# Patient Record
Sex: Female | Born: 1965 | Race: Black or African American | Hispanic: No | Marital: Single | Smoking: Never smoker
Health system: Southern US, Community
[De-identification: ages and names within clinical notes are randomized; demographics above are authoritative.]

## PROBLEM LIST (undated history)

## (undated) DIAGNOSIS — R030 Elevated blood-pressure reading, without diagnosis of hypertension: Secondary | ICD-10-CM

## (undated) DIAGNOSIS — IMO0001 Reserved for inherently not codable concepts without codable children: Secondary | ICD-10-CM

## (undated) DIAGNOSIS — D649 Anemia, unspecified: Secondary | ICD-10-CM

## (undated) DIAGNOSIS — G4730 Sleep apnea, unspecified: Secondary | ICD-10-CM

## (undated) DIAGNOSIS — M1990 Unspecified osteoarthritis, unspecified site: Secondary | ICD-10-CM

## (undated) DIAGNOSIS — I10 Essential (primary) hypertension: Secondary | ICD-10-CM

## (undated) DIAGNOSIS — Z923 Personal history of irradiation: Secondary | ICD-10-CM

## (undated) DIAGNOSIS — C50919 Malignant neoplasm of unspecified site of unspecified female breast: Secondary | ICD-10-CM

## (undated) HISTORY — DX: Essential (primary) hypertension: I10

## (undated) HISTORY — DX: Unspecified osteoarthritis, unspecified site: M19.90

## (undated) HISTORY — DX: Malignant neoplasm of unspecified site of unspecified female breast: C50.919

---

## 2004-05-31 ENCOUNTER — Emergency Department: Payer: Self-pay | Admitting: General Practice

## 2006-11-05 ENCOUNTER — Emergency Department: Payer: Self-pay | Admitting: Emergency Medicine

## 2008-01-12 ENCOUNTER — Emergency Department: Payer: Self-pay | Admitting: Unknown Physician Specialty

## 2008-05-30 ENCOUNTER — Emergency Department: Payer: Self-pay | Admitting: Emergency Medicine

## 2008-10-18 ENCOUNTER — Emergency Department: Payer: Self-pay | Admitting: Emergency Medicine

## 2010-08-12 ENCOUNTER — Ambulatory Visit: Payer: Self-pay | Admitting: Family Medicine

## 2011-09-09 ENCOUNTER — Ambulatory Visit: Payer: Self-pay | Admitting: Family Medicine

## 2011-11-28 ENCOUNTER — Emergency Department (HOSPITAL_COMMUNITY)
Admission: EM | Admit: 2011-11-28 | Discharge: 2011-11-28 | Disposition: A | Discharge status: Home / Self Care | Payer: Medicaid Other | Attending: Emergency Medicine | Admitting: Emergency Medicine

## 2011-11-28 ENCOUNTER — Encounter (HOSPITAL_COMMUNITY): Payer: Self-pay | Admitting: *Deleted

## 2011-11-28 DIAGNOSIS — J029 Acute pharyngitis, unspecified: Secondary | ICD-10-CM

## 2011-11-28 DIAGNOSIS — J019 Acute sinusitis, unspecified: Secondary | ICD-10-CM

## 2011-11-28 DIAGNOSIS — R070 Pain in throat: Secondary | ICD-10-CM | POA: Insufficient documentation

## 2011-11-28 MED ORDER — AMOXICILLIN-POT CLAVULANATE 500-125 MG PO TABS: 1.0000 | ORAL_TABLET | Freq: Two times a day (BID) | ORAL | Status: AC

## 2011-11-28 NOTE — ED Notes (Signed)
To ED for eval of congestion and sore throat for the past 4 days.

## 2011-11-28 NOTE — ED Provider Notes (Signed)
History     CSN: [id number]  Arrival date & time 11/28/11  1109   First MD Initiated Contact with Patient 11/28/11 1130      Chief Complaint  Patient presents with  . Nasal Congestion  . Sore Throat    (Consider location/radiation/quality/duration/timing/severity/associated sxs/prior treatment) HPI Comments: 46 year old female presents to the ED complaining of sore throat, nasal congestion, and sinus pressure for the past 3 days. Admits to associated cold sweats, cough with green mucus, and runny nose with green rhinorrhea. Cough worse when she first wakes up in the morning and subsides throughout the day. States she feels tired and her glands feel swollen. She tried taking Alka-Seltzer without any relief of her symptoms. Denies any abdominal pain, nausea, vomiting, rashes, chest pain, shortness of breath, weakness.  Patient is a 46 y.o. female presenting with pharyngitis. The history is provided by the patient.  Sore Throat Associated symptoms include congestion, coughing, diaphoresis, fatigue and a sore throat. Pertinent negatives include no abdominal pain, chest pain, fever, nausea, rash, vomiting or weakness.    History reviewed. No pertinent past medical history.  History reviewed. No pertinent past surgical history.  History reviewed. No pertinent family history.  History  Substance Use Topics  . Smoking status: Never Smoker   . Smokeless tobacco: Not on file  . Alcohol Use: No    OB History    Grav Para Term Preterm Abortions TAB SAB Ect Mult Living                  Review of Systems  Constitutional: Positive for diaphoresis and fatigue. Negative for fever and appetite change.  HENT: Positive for congestion, sore throat, rhinorrhea and sinus pressure. Negative for trouble swallowing.   Respiratory: Positive for cough. Negative for shortness of breath.   Cardiovascular: Negative for chest pain.  Gastrointestinal: Negative for nausea, vomiting and abdominal pain.    Skin: Negative for rash.  Neurological: Negative for dizziness, weakness and light-headedness.  Hematological: Positive for adenopathy.    Allergies  Review of patient's allergies indicates no known allergies.  Home Medications   Current Outpatient Rx  Name Route Sig Dispense Refill  . NYQUIL PO Oral Take 2 capsules by mouth 2 (two) times daily as needed. Cold and congestion    . AMOXICILLIN-POT CLAVULANATE 500-125 MG PO TABS Oral Take 1 tablet (500 mg total) by mouth 2 (two) times daily. 14 tablet 0    BP 115/77  Pulse 102  Temp 98.3 F (36.8 C) (Oral)  Resp 18  SpO2 99%  Physical Exam  Constitutional: She is oriented to person, place, and time. She appears well-developed and well-nourished. No distress.  HENT:  Head: Normocephalic and atraumatic.  Right Ear: Tympanic membrane, external ear and ear canal normal.  Left Ear: Tympanic membrane, external ear and ear canal normal.  Nose: Mucosal edema present. Right sinus exhibits maxillary sinus tenderness and frontal sinus tenderness. Left sinus exhibits maxillary sinus tenderness and frontal sinus tenderness.  Mouth/Throat: Uvula is midline and mucous membranes are normal. Posterior oropharyngeal erythema present. No oropharyngeal exudate or posterior oropharyngeal edema.       Post nasal drip present  Eyes: Conjunctivae are normal.  Neck: Normal range of motion. Neck supple.  Cardiovascular: Normal rate, regular rhythm and normal heart sounds.   Pulmonary/Chest: Effort normal and breath sounds normal. She has no wheezes.  Musculoskeletal: Normal range of motion. She exhibits no edema.  Lymphadenopathy:       Head (right side): Submandibular adenopathy  present.       Head (left side): Submandibular adenopathy present.    She has cervical adenopathy.       Right cervical: Superficial cervical adenopathy present.       Left cervical: Superficial cervical adenopathy present.  Neurological: She is alert and oriented to person,  place, and time.  Skin: Skin is warm. No rash noted.       clammy    ED Course  Procedures (including critical care time)  Labs Reviewed - No data to display No results found.   1. Sinusitis acute   2. Sore throat       MDM  46 year old female with acute sinusitis. Will treat with Augmentin. I gave her instructions for saline rinses, salt water gargles and advised her to take ibuprofen or Tylenol as needed.       Trevor Mace, PA-C 11/28/11 1156

## 2011-11-28 NOTE — ED Provider Notes (Signed)
Medical screening examination/treatment/procedure(s) were performed by non-physician practitioner and as supervising physician I was immediately available for consultation/collaboration.  Doug Sou, MD 11/28/11 [phone]

## 2013-04-03 ENCOUNTER — Ambulatory Visit: Payer: Self-pay

## 2013-05-08 ENCOUNTER — Ambulatory Visit: Payer: Self-pay

## 2014-07-11 ENCOUNTER — Other Ambulatory Visit: Payer: Self-pay | Admitting: *Deleted

## 2014-07-11 DIAGNOSIS — Z1231 Encounter for screening mammogram for malignant neoplasm of breast: Secondary | ICD-10-CM

## 2014-08-11 ENCOUNTER — Ambulatory Visit: Payer: Medicaid Other | Attending: Oncology

## 2014-08-11 ENCOUNTER — Ambulatory Visit
Admission: RE | Admit: 2014-08-11 | Discharge: 2014-08-11 | Disposition: A | Discharge status: Home / Self Care | Payer: Self-pay | Source: Ambulatory Visit | Attending: Oncology | Admitting: Oncology

## 2014-08-11 VITALS — Ht 64.57 in | Wt 285.3 lb

## 2014-08-11 DIAGNOSIS — Z0000 Encounter for general adult medical examination without abnormal findings: Secondary | ICD-10-CM

## 2014-08-11 DIAGNOSIS — R921 Mammographic calcification found on diagnostic imaging of breast: Secondary | ICD-10-CM | POA: Insufficient documentation

## 2014-08-11 DIAGNOSIS — R920 Mammographic microcalcification found on diagnostic imaging of breast: Secondary | ICD-10-CM

## 2014-08-11 DIAGNOSIS — Z1231 Encounter for screening mammogram for malignant neoplasm of breast: Secondary | ICD-10-CM | POA: Insufficient documentation

## 2014-08-11 NOTE — Progress Notes (Signed)
Subjective:     Patient ID: Felicia Ford, female   DOB: 12/05/1965, 49 y.o.   MRN: [id number]  HPI   Review of Systems     Objective:   Physical Exam  Pulmonary/Chest: Right breast exhibits no inverted nipple, no mass, no nipple discharge, no skin change and no tenderness. Left breast exhibits no inverted nipple, no mass, no nipple discharge, no skin change and no tenderness. Breasts are symmetrical.       Assessment:  49 year old patient presents for [facility] clinic visit.  CBE unremarkable.  Large pendulous breasts.  Instructed patient on breast self-exam using teach back method.    Plan:      Sent for bilateral screening mammogram.

## 2014-08-18 ENCOUNTER — Ambulatory Visit: Admission: RE | Admit: 2014-08-18 | Payer: Self-pay | Source: Ambulatory Visit

## 2014-08-18 ENCOUNTER — Other Ambulatory Visit: Payer: Self-pay | Admitting: *Deleted

## 2014-08-18 ENCOUNTER — Ambulatory Visit
Admission: RE | Admit: 2014-08-18 | Discharge: 2014-08-18 | Disposition: A | Discharge status: Home / Self Care | Payer: Self-pay | Source: Ambulatory Visit | Attending: Oncology | Admitting: Oncology

## 2014-08-18 DIAGNOSIS — R920 Mammographic microcalcification found on diagnostic imaging of breast: Secondary | ICD-10-CM | POA: Insufficient documentation

## 2014-08-18 DIAGNOSIS — R921 Mammographic calcification found on diagnostic imaging of breast: Secondary | ICD-10-CM

## 2014-08-26 ENCOUNTER — Other Ambulatory Visit: Payer: Self-pay | Admitting: Oncology

## 2014-08-26 ENCOUNTER — Other Ambulatory Visit: Payer: Self-pay | Admitting: *Deleted

## 2014-08-26 ENCOUNTER — Ambulatory Visit
Admission: RE | Admit: 2014-08-26 | Discharge: 2014-08-26 | Disposition: A | Discharge status: Home / Self Care | Payer: Self-pay | Source: Ambulatory Visit | Attending: Oncology | Admitting: Oncology

## 2014-08-26 ENCOUNTER — Ambulatory Visit: Admission: RE | Admit: 2014-08-26 | Payer: Self-pay | Source: Ambulatory Visit

## 2014-08-26 DIAGNOSIS — R921 Mammographic calcification found on diagnostic imaging of breast: Secondary | ICD-10-CM

## 2014-08-26 DIAGNOSIS — C50919 Malignant neoplasm of unspecified site of unspecified female breast: Secondary | ICD-10-CM

## 2014-08-26 DIAGNOSIS — D0512 Intraductal carcinoma in situ of left breast: Secondary | ICD-10-CM | POA: Insufficient documentation

## 2014-08-26 HISTORY — PX: BREAST BIOPSY: SHX20

## 2014-08-26 HISTORY — DX: Malignant neoplasm of unspecified site of unspecified female breast: C50.919

## 2014-08-27 LAB — SURGICAL PATHOLOGY

## 2014-08-28 ENCOUNTER — Encounter: Payer: Self-pay | Admitting: *Deleted

## 2014-08-28 NOTE — Progress Notes (Signed)
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 Patient notified of results.  Introduced IT trainer.  Scheduled patient with Dr. Bary Castilla on Monday 09/01/14 at 4:30.  Patient to arrive at 4:00 p.m. with photo ID, and current medications.   She is coming in on 08/29/14 at 8:00 a.m. to fill out [facility] paperwork with Tanya Nones RN. Oncology Nurse Navigator Documentation  Oncology Nurse Navigator Flowsheets 08/28/2014  Navigator Encounter Type Introductory phone call  Patient Visit Type Initial  Support Groups/Services Other  Time Spent with Patient 30

## 2014-08-29 ENCOUNTER — Encounter: Payer: Self-pay | Admitting: *Deleted

## 2014-08-29 NOTE — Progress Notes (Signed)
Met with patient today to complete genetic testing.  Patient meets NCCN guidelines for genetic testing based on her personal and family history.  Family history includes: patient with DCIS at age 49, her mother with ovarian cancer at age 59, materal grandmother with unknown cancer at age 69, maternal uncle with lung cancer age 18, and a materal great uncle with lung cancer at age 7.   Specimen collected for [facility] test and shipped to [facility] via [employer].  Discussed one of the Oncologist will review test results and any new medical management if needed.  Gave patient breast cancer educational literature, "My Breast Cancer Treatment Handbook" by Josephine Igo, RN.  Completed paperwork for [facility] application.  Offered support.  Patient is to call if she has any questions or needs.

## 2014-09-01 ENCOUNTER — Ambulatory Visit: Payer: Self-pay | Admitting: General Surgery

## 2014-09-01 ENCOUNTER — Other Ambulatory Visit: Payer: Self-pay | Admitting: General Surgery

## 2014-09-01 ENCOUNTER — Ambulatory Visit (INDEPENDENT_AMBULATORY_CARE_PROVIDER_SITE_OTHER): Payer: Medicaid Other | Admitting: General Surgery

## 2014-09-01 ENCOUNTER — Encounter: Payer: Self-pay | Admitting: General Surgery

## 2014-09-01 VITALS — BP 132/80 | HR 78 | Resp 14 | Ht 62.0 in | Wt 284.0 lb

## 2014-09-01 DIAGNOSIS — D0512 Intraductal carcinoma in situ of left breast: Secondary | ICD-10-CM | POA: Diagnosis not present

## 2014-09-01 NOTE — Progress Notes (Signed)
Patient ID: Felicia Ford, female DOB: 02/16/1966, 48 y.o. MRN: [id number]  Chief Complaint  Patient presents with  . Other    left breast Cancer    HPI Felicia Ford is a 48 y.o. female here for breast cancer discussion. She had her yearly mammogram done on 08/11/14 with added left side views on 08/18/14. She had a stereo biopsy of the left breast done on 08/26/14 at [facility] which came back positive for DCIS. she does have a family history of different cancers on her mother's side of the family. She had genetic testing done at the hospital on 08/29/14. HPI  Past Medical History  Diagnosis Date  . 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 Breast biopsy Left 08/26/2014    waiting on path results    Family History  Problem Relation Age of Onset  . Diabetes Mother   . Lupus Sister   . Lung cancer Maternal Uncle     Social History History  Substance Use Topics  . Smoking status: Never Smoker   . Smokeless tobacco: Never Used  . Alcohol Use: No    No Known Allergies  No current outpatient prescriptions on file.   No current facility-administered medications for this visit.    Review of Systems Review of Systems  Constitutional: Negative.  Respiratory: Negative.  Cardiovascular: Negative.    Blood pressure 132/80, pulse 78, resp. rate 14, height 5' 2" (1.575 m), weight 284 lb (128.822 kg), last menstrual period 08/22/2014.  Physical Exam Physical Exam  Constitutional: She is oriented to person, place, and time. She appears well-developed and well-nourished.  Eyes: Conjunctivae are normal. No scleral icterus.  Neck: Neck supple.  Cardiovascular: Normal rate, regular rhythm and normal heart sounds.  Pulmonary/Chest: Effort normal and breath sounds normal. Right breast exhibits no inverted nipple, no mass, no nipple discharge, no skin change and  no tenderness. Left breast exhibits no inverted nipple, no mass, no nipple discharge, no skin change and no tenderness.  Left  Lymphadenopathy:   She has no cervical adenopathy.   She has no axillary adenopathy.  Neurological: She is alert and oriented to person, place, and time.  Skin: Skin is warm and dry.    Data Reviewed Pathology showed intermediate grade DCIS. Receptor status pending excision. The pathologist made note that the area of DCIS extended beyond the area of microcalcifications in the sample submitted.  Assessment    DCIS of the left breast.    Plan    The patient underwent genetic testing through the cancer Center based on age of diagnosis and reported family history of ovarian cancer in her mother. Her case was presented at the breast tumor conference the evening of her evaluation and recommendation has been to postpone surgical intervention until the testing is back. If she was BRCA positive consideration would be given to bilateral mastectomy, although this would be an exceptionally morbid procedure in this massively obese woman with huge breasts. If the results are available will proceed with plans for surgical intervention on June 23, otherwise will need to postpone surgical intervention.  As there is a significant discrepancy between the biopsy clip placed at the time of her procedure the breast radiologist will plan to place a second clip on Wednesday, June 8 at the original biopsy site if it is visible.    Patient is scheduled for surgery at [facility] on 09/18/14. She will arrive at the   [facility] that day at 8:00 am. She will pre admit by phone. Patient is aware of date, time, and instructions.   Ref: Sheena Lambert BCCCP PCP: [facility]   Byrnett, Jeffrey W 09/02/2014, 7:53 AM      

## 2014-09-01 NOTE — Patient Instructions (Addendum)
Lumpectomy A lumpectomy is a form of "breast conserving" or "breast preservation" surgery. It may also be referred to as a partial mastectomy. During a lumpectomy, the portion of the breast that contains the cancerous tumor or breast mass (the lump) is removed. Some normal tissue around the lump may also be removed to make sure all of the tumor has been removed.  LET YOUR HEALTH CARE PROVIDER KNOW ABOUT:  Any allergies you have.  All medicines you are taking, including vitamins, herbs, eye drops, creams, and over-the-counter medicines.  Previous problems you or members of your family have had with the use of anesthetics.  Any blood disorders you have.  Previous surgeries you have had.  Medical conditions you have. RISKS AND COMPLICATIONS Generally, this is a safe procedure. However, problems can occur and include:  Bleeding.  Infection.  Pain.  Temporary swelling.  Change in the shape of the breast, particularly if a large portion is removed. BEFORE THE PROCEDURE  Ask your health care provider about changing or stopping your regular medicines. This is especially important if you are taking diabetes medicines or blood thinners.  Do not eat or drink anything after midnight on the night before the procedure or as directed by your health care provider. Ask your health care provider if you can take a sip of water with any approved medicines.  On the day of surgery, your health care provider will use a mammogram or ultrasound to locate and mark the tumor in your breast. These markings on your breast will show where the cut (incision) will be made. PROCEDURE   An IV tube will be put into one of your veins.  You may be given medicine to help you relax before the surgery (sedative). You will be given one of the following:  A medicine that numbs the area (local anesthetic).  A medicine that makes you fall asleep (general anesthetic).  Your health care provider will use a kind of  electric scalpel that uses heat to minimize bleeding (electrocautery knife).  A curved incision (like a smile or frown) that follows the natural curve of your breast is made, to allow for minimal scarring and better healing.  The tumor will be removed with some of the surrounding tissue. This will be sent to the lab for analysis. Your health care provider may also remove your lymph nodes at this time if needed.  Sometimes, but not always, a rubber tube called a drain will be surgically inserted into your breast area or armpit to collect excess fluid that may accumulate in the space where the tumor was. This drain is connected to a plastic bulb on the outside of your body. This drain creates suction to help remove the fluid.  The incisions will be closed with stitches (sutures).  A bandage may be placed over the incisions. AFTER THE PROCEDURE  You will be taken to the recovery area.  You will be given medicine for pain.  A small rubber drain may be placed in the breast for 2-3 days to prevent a collection of blood (hematoma) from developing in the breast. You will be given instructions on caring for the drain before you go home.  A pressure bandage (dressing) will be applied for 1-2 days to prevent bleeding. Ask your health care provider how to care for your bandage at home. Document Released: 04/25/2006 Document Revised: 07/29/2013 Document Reviewed: 08/17/2012 ExitCare Patient Information 2015 [employer]. This information is not intended to replace advice given to you by   your health care provider. Make sure you discuss any questions you have with your health care provider.  Patient is scheduled for surgery at [facility] on 09/18/14. She will arrive at the [facility] that day at 8:00 am. She will pre admit by phone. Patient is aware of date, time, and instructions.

## 2014-09-02 ENCOUNTER — Other Ambulatory Visit: Payer: Self-pay | Admitting: General Surgery

## 2014-09-02 DIAGNOSIS — C50912 Malignant neoplasm of unspecified site of left female breast: Secondary | ICD-10-CM

## 2014-09-02 DIAGNOSIS — D0510 Intraductal carcinoma in situ of unspecified breast: Secondary | ICD-10-CM | POA: Insufficient documentation

## 2014-09-02 NOTE — H&P (Signed)
Patient ID: Felicia Ford, female DOB: 1965-08-25, 49 y.o. MRN: [id number]  Chief Complaint  Patient presents with  . Other    left breast Cancer    HPI Felicia Ford is a 49 y.o. female here for breast cancer discussion. She had her yearly mammogram done on 08/11/14 with added left side views on 08/18/14. She had a stereo biopsy of the left breast done on 08/26/14 at [facility] which came back positive for DCIS. she does have a family history of different cancers on her mother's side of the family. She had genetic testing done at the hospital on 08/29/14. HPI  Past Medical History  Diagnosis Date  . 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 Breast biopsy Left 08/26/2014    waiting on path results    Family History  Problem Relation Age of Onset  . Diabetes Mother   . Lupus Sister   . Lung cancer Maternal Uncle     Social History History  Substance Use Topics  . Smoking status: Never Smoker   . Smokeless tobacco: Never Used  . Alcohol Use: No    No Known Allergies  No current outpatient prescriptions on file.   No current facility-administered medications for this visit.    Review of Systems Review of Systems  Constitutional: Negative.  Respiratory: Negative.  Cardiovascular: Negative.    Blood pressure 132/80, pulse 78, resp. rate 14, height _0  (1.575 m), weight 284 lb (128.822 kg), last menstrual period 08/22/2014.  Physical Exam Physical Exam  Constitutional: She is oriented to person, place, and time. She appears well-developed and well-nourished.  Eyes: Conjunctivae are normal. No scleral icterus.  Neck: Neck supple.  Cardiovascular: Normal rate, regular rhythm and normal heart sounds.  Pulmonary/Chest: Effort normal and breath sounds normal. Right breast exhibits no inverted nipple, no mass, no nipple discharge, no skin change and  no tenderness. Left breast exhibits no inverted nipple, no mass, no nipple discharge, no skin change and no tenderness.  Left  Lymphadenopathy:   She has no cervical adenopathy.   She has no axillary adenopathy.  Neurological: She is alert and oriented to person, place, and time.  Skin: Skin is warm and dry.    Data Reviewed Pathology showed intermediate grade DCIS. Receptor status pending excision. The pathologist made note that the area of DCIS extended beyond the area of microcalcifications in the sample submitted.  Assessment    DCIS of the left breast.    Plan    The patient underwent genetic testing through the cancer Center based on age of diagnosis and reported family history of ovarian cancer in her mother. Her case was presented at the breast tumor conference the evening of her evaluation and recommendation has been to postpone surgical intervention until the testing is back. If she was BRCA positive consideration would be given to bilateral mastectomy, although this would be an exceptionally morbid procedure in this massively obese woman with huge breasts. If the results are available will proceed with plans for surgical intervention on June 23, otherwise will need to postpone surgical intervention.  As there is a significant discrepancy between the biopsy clip placed at the time of her procedure the breast radiologist will plan to place a second clip on Wednesday, June 8 at the original biopsy site if it is visible.    Patient is scheduled for surgery at [facility] on 09/18/14. She will arrive at the  [place] that day at 8:00 am. She will pre admit by phone. Patient is aware of date, time, and instructions.   Ref: Wende Neighbors PCP: [facility] 09/02/2014, 7:53 AM

## 2014-09-03 ENCOUNTER — Other Ambulatory Visit: Payer: Self-pay | Admitting: Oncology

## 2014-09-03 ENCOUNTER — Ambulatory Visit
Admission: RE | Admit: 2014-09-03 | Discharge: 2014-09-03 | Disposition: A | Discharge status: Home / Self Care | Payer: Self-pay | Source: Ambulatory Visit | Attending: General Surgery | Admitting: General Surgery

## 2014-09-03 ENCOUNTER — Ambulatory Visit: Admission: RE | Admit: 2014-09-03 | Payer: Self-pay | Source: Ambulatory Visit

## 2014-09-03 DIAGNOSIS — C50912 Malignant neoplasm of unspecified site of left female breast: Secondary | ICD-10-CM | POA: Insufficient documentation

## 2014-09-03 DIAGNOSIS — Z0000 Encounter for general adult medical examination without abnormal findings: Secondary | ICD-10-CM

## 2014-09-05 NOTE — Progress Notes (Signed)
Received call from [place].  Patient there to see if biopsy clip needs to be moved.  Anxious.  Went to support patient.  Dr. Shon Hale explained to patient that procedure was not necessary, and that clip was in correct location.  Patient will return for wire localization, and surgery on 09/18/14.  Oncology Nurse Navigator Documentation  Oncology Nurse Navigator Flowsheets 08/28/2014 09/05/2014  Navigator Encounter Type Introductory phone call Other  Patient Visit Type Initial -  Support Groups/Services Other -  Time Spent with Patient 30 46

## 2014-09-10 ENCOUNTER — Encounter: Payer: Self-pay | Admitting: *Deleted

## 2014-09-10 ENCOUNTER — Other Ambulatory Visit: Payer: Self-pay

## 2014-09-10 DIAGNOSIS — Z833 Family history of diabetes mellitus: Secondary | ICD-10-CM | POA: Diagnosis not present

## 2014-09-10 DIAGNOSIS — Z8489 Family history of other specified conditions: Secondary | ICD-10-CM | POA: Diagnosis not present

## 2014-09-10 DIAGNOSIS — C50912 Malignant neoplasm of unspecified site of left female breast: Secondary | ICD-10-CM | POA: Diagnosis present

## 2014-09-10 DIAGNOSIS — Z801 Family history of malignant neoplasm of trachea, bronchus and lung: Secondary | ICD-10-CM | POA: Diagnosis not present

## 2014-09-10 DIAGNOSIS — D0512 Intraductal carcinoma in situ of left breast: Secondary | ICD-10-CM | POA: Diagnosis not present

## 2014-09-10 NOTE — Patient Instructions (Addendum)
  Your procedure is scheduled on: 09-18-14 Report to [facility]   Remember: Instructions that are not followed completely may result in serious medical risk, up to and including death, or upon the discretion of your surgeon and anesthesiologist your surgery may need to be rescheduled.    _X___ 1. Do not eat food or drink liquids after midnight. No gum chewing or hard candies.     _X___ 2. No Alcohol for 24 hours before or after surgery.   ____ 3. Bring all medications with you on the day of surgery if instructed.    _X___ 4. Notify your doctor if there is any change in your medical condition     (cold, fever, infections).     Do not wear jewelry, make-up, hairpins, clips or nail polish.  Do not wear lotions, powders, or perfumes. You may wear deodorant.  Do not shave 48 hours prior to surgery. Men may shave face and neck.  Do not bring valuables to the hospital.    [facility] is not responsible for any belongings or valuables.               Contacts, dentures or bridgework may not be worn into surgery.  Leave your suitcase in the car. After surgery it may be brought to your room.  For patients admitted to the hospital, discharge time is determined by your treatment team.   Patients discharged the day of surgery will not be allowed to drive home.   Please read over the following fact sheets that you were given:     ____ Take these medicines the morning of surgery with A SIP OF WATER:    1. NONE  2.   3.   4.  5.  6.  ____ Fleet Enema (as directed)   _X___ Use CHG Soap as directed  ____ Use inhalers on the day of surgery  ____ Stop metformin 2 days prior to surgery    ____ Take 1/2 of usual insulin dose the night before surgery and none on the morning of surgery.   ____ Stop Coumadin/Plavix/aspirin-N/A  ____ Stop Anti-inflammatories-NO NSAIDS OR ASPIRIN PRODUCTS-TYLENOL OK   ____ Stop supplements until after surgery.    ____ Bring C-Pap to the  hospital.

## 2014-09-16 ENCOUNTER — Telehealth: Payer: Self-pay | Admitting: *Deleted

## 2014-09-16 ENCOUNTER — Telehealth: Payer: Self-pay

## 2014-09-16 NOTE — Telephone Encounter (Signed)
-----   Message from Dom[place]a [place], [place] sent at 09/15/2014  1:59 PM EDT ----- Patient is scheduled for surgery for Thursday, 09-18-14. Dr. Bary Castilla wanted to have genetic testing results back before her surgery is done. Since you can look these up online, can you help me keep a check and see if results are available? Genetic testing was ordered by [facility]CP on 08-29-14. Thanks.

## 2014-09-16 NOTE — Telephone Encounter (Signed)
The patient was made aware that the BRCA testing will not be back until next month. Our options for management of this intermediate grade DCIS is as follows: 1) proceed with wide excision with needle localization and follow that up with patient therapy (strong consideration to partial breast based on her massive breast size) versus 2) awaiting the BRCA results and considering bilateral mastectomy as a prophylactic/treatment procedure.  The likelihood of her being BRCA positive is small, and she needs to decide whether she would consider bilateral mastectomy. She would be exceptionally difficult to reconstruct based on her size, and university referral would be recommended.  She'll consider her options and notify us tomorrow how she would like to proceed, or simply put things on hold pending the BRCA test results. 

## 2014-09-16 NOTE — Telephone Encounter (Signed)
She will check to see regarding results and let us know.

## 2014-09-16 NOTE — Telephone Encounter (Signed)
Testing actually started 09-10-14 (waited on her 1040 for $ issues). Target completion date 10-01-14 per Al Pimple RN.

## 2014-09-16 NOTE — Telephone Encounter (Signed)
Returned phone call to Karie Fetch RN at Dr. Dwyane Luo office regarding genetic test results for patient.   Per Tawana Scale at [facility], the projected date for test result release is October 01, 2014.

## 2014-09-17 ENCOUNTER — Telehealth: Payer: Self-pay | Admitting: *Deleted

## 2014-09-17 NOTE — Telephone Encounter (Signed)
-----   Message from [facility], LPN sent at 12/02/2834  9:11 AM EDT ----- Spoke with Estill Bamberg at [place] and she contacted the patient and she will be having the marker clip placed first thing tomorrow morning as the patient could not make it in today. I also spoke with Koren Shiver and notified her of this and asked that she contact us first thing when the patient's genetic testing results came back as the patent's surgery may have to be postponed if the results are not back in time.  ----- Message -----    From: Robert Bellow, MD    Sent: 09/01/2014   7:21 PM      To: Lesly Rubenstein, LPN  Please call mammography first thing in AM. They will get patient in to place a new clip in the breast at the original biopsy site.  Also, notify the patient that we will be waiting for her genetic test results to come back before surgery, may have to postpone the June 23 date, hopefully not.

## 2014-09-17 NOTE — Telephone Encounter (Signed)
Patient was contacted today and she wants to proceed with surgery as scheduled for tomorrow, 09-18-14.  Patient was instructed to arrive at mammography at 11:30 am instead of 8 am like she was originally instructed. This patient verbalizes understanding. She will call the office if she has further questions.

## 2014-09-17 NOTE — Progress Notes (Unsigned)
Notified pateint, and Dr. Bary Castilla of negative MyRISK results.  Patient reports she will be in [facility] 09/18/14 at 11:00 for wire localzation. Plan to meet there.

## 2014-09-18 ENCOUNTER — Encounter: Payer: Self-pay | Admitting: *Deleted

## 2014-09-18 ENCOUNTER — Ambulatory Visit: Payer: Medicaid Other | Admitting: Anesthesiology

## 2014-09-18 ENCOUNTER — Encounter
Admission: RE | Disposition: A | Discharge status: Home / Self Care | Payer: Self-pay | Source: Ambulatory Visit | Attending: General Surgery

## 2014-09-18 ENCOUNTER — Ambulatory Visit
Admission: RE | Admit: 2014-09-18 | Discharge: 2014-09-18 | Disposition: A | Discharge status: Home / Self Care | Payer: Medicaid Other | Source: Ambulatory Visit | Attending: General Surgery | Admitting: General Surgery

## 2014-09-18 ENCOUNTER — Other Ambulatory Visit: Payer: Self-pay | Admitting: Oncology

## 2014-09-18 ENCOUNTER — Ambulatory Visit
Admission: RE | Admit: 2014-09-18 | Discharge: 2014-09-18 | Disposition: A | Discharge status: Home / Self Care | Payer: Medicaid Other | Source: Ambulatory Visit | Attending: Oncology | Admitting: Oncology

## 2014-09-18 DIAGNOSIS — D0512 Intraductal carcinoma in situ of left breast: Secondary | ICD-10-CM | POA: Insufficient documentation

## 2014-09-18 DIAGNOSIS — Z0000 Encounter for general adult medical examination without abnormal findings: Secondary | ICD-10-CM

## 2014-09-18 DIAGNOSIS — Z801 Family history of malignant neoplasm of trachea, bronchus and lung: Secondary | ICD-10-CM | POA: Insufficient documentation

## 2014-09-18 DIAGNOSIS — Z833 Family history of diabetes mellitus: Secondary | ICD-10-CM | POA: Insufficient documentation

## 2014-09-18 DIAGNOSIS — Z8489 Family history of other specified conditions: Secondary | ICD-10-CM | POA: Insufficient documentation

## 2014-09-18 HISTORY — PX: BREAST BIOPSY: SHX20

## 2014-09-18 HISTORY — DX: Reserved for inherently not codable concepts without codable children: IMO0001

## 2014-09-18 HISTORY — DX: Anemia, unspecified: D64.9

## 2014-09-18 HISTORY — DX: Sleep apnea, unspecified: G47.30

## 2014-09-18 HISTORY — PX: BREAST LUMPECTOMY: SHX2

## 2014-09-18 HISTORY — DX: Elevated blood-pressure reading, without diagnosis of hypertension: R03.0

## 2014-09-18 LAB — POCT PREGNANCY, URINE: PREG TEST UR: NEGATIVE

## 2014-09-18 SURGERY — BREAST BIOPSY WITH NEEDLE LOCALIZATION
Anesthesia: General | Laterality: Left

## 2014-09-18 MED ORDER — ONDANSETRON HCL 4 MG/2ML IJ SOLN
4.0000 mg | Freq: Once | INTRAMUSCULAR | Status: AC | PRN
  Administered 2014-09-18: 4 mg via INTRAVENOUS

## 2014-09-18 MED ORDER — FENTANYL CITRATE (PF) 100 MCG/2ML IJ SOLN
INTRAMUSCULAR | Status: DC | PRN
  Administered 2014-09-18 (×2): 100 ug via INTRAVENOUS

## 2014-09-18 MED ORDER — CEFAZOLIN SODIUM-DEXTROSE 2-3 GM-% IV SOLR
2.0000 g | Freq: Once | INTRAVENOUS | Status: AC
  Administered 2014-09-18: 2 g via INTRAVENOUS

## 2014-09-18 MED ORDER — LACTATED RINGERS IV SOLN: INTRAVENOUS | Status: DC

## 2014-09-18 MED ORDER — CEFAZOLIN SODIUM-DEXTROSE 2-3 GM-% IV SOLR
INTRAVENOUS | Status: AC
  Administered 2014-09-18: 2 g via INTRAVENOUS
  Filled 2014-09-18: qty 50

## 2014-09-18 MED ORDER — HYDROCODONE-ACETAMINOPHEN 5-325 MG PO TABS: 1.0000 | ORAL_TABLET | Freq: Four times a day (QID) | ORAL | Status: DC | PRN

## 2014-09-18 MED ORDER — ONDANSETRON HCL 4 MG/2ML IJ SOLN
INTRAMUSCULAR | Status: DC | PRN
  Administered 2014-09-18: 4 mg via INTRAVENOUS

## 2014-09-18 MED ORDER — MIDAZOLAM HCL 2 MG/2ML IJ SOLN
INTRAMUSCULAR | Status: DC | PRN
  Administered 2014-09-18: 2 mg via INTRAVENOUS

## 2014-09-18 MED ORDER — FENTANYL CITRATE (PF) 100 MCG/2ML IJ SOLN
INTRAMUSCULAR | Status: AC
  Administered 2014-09-18: 25 ug via INTRAVENOUS
  Filled 2014-09-18: qty 2

## 2014-09-18 MED ORDER — LIDOCAINE HCL (CARDIAC) 20 MG/ML IV SOLN
INTRAVENOUS | Status: DC | PRN
  Administered 2014-09-18 (×2): 100 mg via INTRAVENOUS

## 2014-09-18 MED ORDER — SUCCINYLCHOLINE CHLORIDE 20 MG/ML IJ SOLN
INTRAMUSCULAR | Status: DC | PRN
  Administered 2014-09-18: 100 mg via INTRAVENOUS

## 2014-09-18 MED ORDER — ONDANSETRON HCL 4 MG/2ML IJ SOLN
INTRAMUSCULAR | Status: AC
  Administered 2014-09-18: 4 mg via INTRAVENOUS
  Filled 2014-09-18: qty 2

## 2014-09-18 MED ORDER — LACTATED RINGERS IV SOLN
INTRAVENOUS | Status: DC | PRN
  Administered 2014-09-18: 15:00:00 via INTRAVENOUS

## 2014-09-18 MED ORDER — PROPOFOL 10 MG/ML IV BOLUS
INTRAVENOUS | Status: DC | PRN
  Administered 2014-09-18: 200 mg via INTRAVENOUS

## 2014-09-18 MED ORDER — ACETAMINOPHEN 10 MG/ML IV SOLN
INTRAVENOUS | Status: AC
  Filled 2014-09-18: qty 100

## 2014-09-18 MED ORDER — FENTANYL CITRATE (PF) 100 MCG/2ML IJ SOLN
25.0000 ug | INTRAMUSCULAR | Status: DC | PRN
  Administered 2014-09-18 (×4): 25 ug via INTRAVENOUS

## 2014-09-18 MED ORDER — STERILE WATER FOR IRRIGATION IR SOLN
Status: DC | PRN
  Administered 2014-09-18: 100 mL

## 2014-09-18 MED ORDER — FAMOTIDINE 20 MG PO TABS
ORAL_TABLET | ORAL | Status: AC
  Administered 2014-09-18: 20 mg
  Filled 2014-09-18: qty 1

## 2014-09-18 MED ORDER — ACETAMINOPHEN 10 MG/ML IV SOLN
INTRAVENOUS | Status: DC | PRN
  Administered 2014-09-18: 1000 mg via INTRAVENOUS

## 2014-09-18 MED ORDER — FAMOTIDINE 20 MG PO TABS: 20.0000 mg | ORAL_TABLET | Freq: Once | ORAL | Status: DC

## 2014-09-18 MED ORDER — BUPIVACAINE-EPINEPHRINE (PF) 0.5% -1:200000 IJ SOLN
INTRAMUSCULAR | Status: DC | PRN
  Administered 2014-09-18: 30 mL

## 2014-09-18 MED ORDER — BUPIVACAINE-EPINEPHRINE (PF) 0.5% -1:200000 IJ SOLN
INTRAMUSCULAR | Status: AC
  Filled 2014-09-18: qty 30

## 2014-09-18 MED ORDER — HYDROCODONE-ACETAMINOPHEN 5-325 MG PO TABS
ORAL_TABLET | ORAL | Status: AC
  Administered 2014-09-18: 1
  Filled 2014-09-18: qty 1

## 2014-09-18 SURGICAL SUPPLY — 42 items
BANDAGE ELASTIC 6 CLIP NS LF (GAUZE/BANDAGES/DRESSINGS) IMPLANT
BENZOIN TINCTURE PRP APPL 2/3 (GAUZE/BANDAGES/DRESSINGS) ×3 IMPLANT
BLADE SURG 15 STRL SS SAFETY (BLADE) ×6 IMPLANT
BNDG GAUZE 4.5X4.1 6PLY STRL (MISCELLANEOUS) IMPLANT
CANISTER SUCT 1200ML W/VALVE (MISCELLANEOUS) ×3 IMPLANT
CHLORAPREP W/TINT 26ML (MISCELLANEOUS) ×3 IMPLANT
CLOSURE WOUND 1/2 X4 (GAUZE/BANDAGES/DRESSINGS) ×1
CNTNR SPEC 2.5X3XGRAD LEK (MISCELLANEOUS)
CONT SPEC 4OZ STER OR WHT (MISCELLANEOUS)
CONTAINER SPEC 2.5X3XGRAD LEK (MISCELLANEOUS) IMPLANT
COVER PROBE ULTRASOUND XLONG L (MISCELLANEOUS) ×3 IMPLANT
DEVICE DUBIN SPECIMEN MAMMOGRA (MISCELLANEOUS) ×3 IMPLANT
DRAPE LAPAROTOMY 100X77 ABD (DRAPES) ×3 IMPLANT
DRESSING TELFA 4X3 1S ST N-ADH (GAUZE/BANDAGES/DRESSINGS) IMPLANT
DRSG TEGADERM 4X4.75 (GAUZE/BANDAGES/DRESSINGS) ×3 IMPLANT
DRSG TELFA 3X8 NADH (GAUZE/BANDAGES/DRESSINGS) ×3 IMPLANT
ELECT CAUTERY BLADE TIP 2.5 (TIP) ×3
ELECT EZSTD 165MM 6.5IN (MISCELLANEOUS) ×3
ELECTRODE CAUTERY BLDE TIP 2.5 (TIP) ×1 IMPLANT
ELECTRODE EZSTD 165MM 6.5IN (MISCELLANEOUS) ×1 IMPLANT
GAUZE FLUFF 18X24 1PLY STRL (GAUZE/BANDAGES/DRESSINGS) ×3 IMPLANT
GLOVE BIO SURGEON STRL SZ7.5 (GLOVE) ×9 IMPLANT
GLOVE INDICATOR 8.0 STRL GRN (GLOVE) ×3 IMPLANT
GOWN STRL REUS W/ TWL LRG LVL3 (GOWN DISPOSABLE) ×2 IMPLANT
GOWN STRL REUS W/TWL LRG LVL3 (GOWN DISPOSABLE) ×4
KIT RM TURNOVER STRD PROC AR (KITS) ×3 IMPLANT
LABEL OR SOLS (LABEL) IMPLANT
MARGIN MAP 10MM (MISCELLANEOUS) ×3 IMPLANT
NDL SAFETY 22GX1.5 (NEEDLE) ×3 IMPLANT
NDL SAFETY 25GX1.5 (NEEDLE) ×3 IMPLANT
PACK BASIN MINOR ARMC (MISCELLANEOUS) ×3 IMPLANT
PAD GROUND ADULT SPLIT (MISCELLANEOUS) ×3 IMPLANT
STRIP CLOSURE SKIN 1/2X4 (GAUZE/BANDAGES/DRESSINGS) ×2 IMPLANT
SUT ETHILON 3-0 FS-10 30 BLK (SUTURE) ×3
SUT VIC AB 2-0 CT1 27 (SUTURE) ×4
SUT VIC AB 2-0 CT1 TAPERPNT 27 (SUTURE) ×2 IMPLANT
SUT VIC AB 4-0 FS2 27 (SUTURE) ×3 IMPLANT
SUTURE EHLN 3-0 FS-10 30 BLK (SUTURE) ×1 IMPLANT
SWABSTK COMLB BENZOIN TINCTURE (MISCELLANEOUS) ×3 IMPLANT
SYR CONTROL 10ML (SYRINGE) ×3 IMPLANT
TAPE TRANSPORE STRL 2 31045 (GAUZE/BANDAGES/DRESSINGS) IMPLANT
WATER STERILE IRR 1000ML POUR (IV SOLUTION) ×3 IMPLANT

## 2014-09-18 NOTE — OR Nursing (Signed)
Dr Terri Piedra in see pt - pt has a "boil" on inside of right upper leg that started draining today- md will order antibiotic

## 2014-09-18 NOTE — Discharge Instructions (Signed)
Wear bra day and night for the next 3 days. You may remove it at that time for showers. You should continue to wear bra day and night after is removed on Sunday.  You may wash under your arms and use deodorant as needed.  Tylenol, Advil, Aleve as needed for soreness.  Norco, hydrocodone, if needed for pain. This medication may constipate.  Laxity of choice if needed.   AMBULATORY SURGERY  DISCHARGE INSTRUCTIONS   1) The drugs that you were given will stay in your system until tomorrow so for the next 24 hours you should not:  A) Drive an automobile B) Make any legal decisions C) Drink any alcoholic beverage   2) You may resume regular meals tomorrow.  Today it is better to start with liquids and gradually work up to solid foods.  You may eat anything you prefer, but it is better to start with liquids, then soup and crackers, and gradually work up to solid foods.   3) Please notify your doctor immediately if you have any unusual bleeding, trouble breathing, redness and pain at the surgery site, drainage, fever, or pain not relieved by medication.                4) Additional Instructions:

## 2014-09-18 NOTE — Transfer of Care (Signed)
Immediate Anesthesia Transfer of Care Note  Patient: Felicia Ford  Procedure(s) Performed: Procedure(s): BREAST BIOPSY WITH NEEDLE LOCALIZATION (Left)  Patient Location: PACU  Anesthesia Type:General  Level of Consciousness: awake, alert , oriented and patient cooperative  Airway & Oxygen Therapy: Patient Spontanous Breathing and Patient connected to nasal cannula oxygen  Post-op Assessment: Report given to RN and Post -op Vital signs reviewed and stable  Post vital signs: Reviewed and stable  Last Vitals:  Filed Vitals:   09/18/14 1636  BP:   Pulse:   Temp: 36.8 C  Resp: 16    Complications: No apparent anesthesia complications

## 2014-09-18 NOTE — OR Nursing (Signed)
Iv attempted 8 times by rn staff and crna with no success pt tolerated well. Dr Boston Service notified of no iv at present

## 2014-09-18 NOTE — Op Note (Addendum)
Preoperative diagnosis: DCIS left breast.  Postoperative diagnosis: Same.  Operative procedure: Wide local excision with wire localization and ultrasound guidance.  Operating surgeon Hervey Ard, M.D.  Anesthesia: Gen. endotracheal, Marcaine 0.5% with 1-200,000 epinephrine 30 mL local infiltration.  Estimated blood loss: 10 mL.  Clinical note this 49 year old woman was found with an abnormal mammogram to have an area of DCIS.Felicia Ford She was a candidate for breast conservation. She underwent wire localization morning of the procedure.  Operative note: With patient under adequate general endotracheal anesthesia the breast was taped medially due to its massive size and the head of the bed elevated to provide better exposure of the uppe outer quadrant. Ultrasound was used to identify the course of the localizing wire to minimize undermining the skin. A curvilinear incision in the 12 to 2:00 position was made and carried at the skin subtenon's tissue with hemostasis achieved by electrocautery. The generous layer of adipose tissue was divided. The wire was found was followed down until there was evidence of changes from her recent biopsy. At this point a 6 x 6 x 8 cm block of tissue extending down to what not including the pectoralis fashion was excised orientated and sent for specimen radiograph confirming removal of the wire and the previously placed localizing clip. Gross examination by pathology showed the closest border to be 1 cm from the biopsy cavity. Wound was irrigated. Marcaine with epinephrine which had been used prior to skin incision was used to fill the remaining biopsy cavity at the end of the procedure for postoperative analgesia and hemostasis. The rest tissue is approximated with running 2-0 Vicryls suture in multiple layers. The skin was closed with a running 4-0 Vicryls septic suture for the skin. Benzoin, Steri-Strips Telfa and Tegaderm dressing was applied. The patient tolerated  procedure well and was taken to recovery room in stable condition.

## 2014-09-18 NOTE — Anesthesia Postprocedure Evaluation (Signed)
  Anesthesia Post-op Note  Patient: Felicia Ford  Procedure(s) Performed: Procedure(s): BREAST BIOPSY WITH NEEDLE LOCALIZATION (Left)  Anesthesia type:General  Patient location: PACU  Post pain: Pain level controlled  Post assessment: Post-op Vital signs reviewed, Patient's Cardiovascular Status Stable, Respiratory Function Stable, Patent Airway and No signs of Nausea or vomiting  Post vital signs: Reviewed and stable  Last Vitals:  Filed Vitals:   09/18/14 1653  BP: 141/81  Pulse: 82  Temp:   Resp: 15    Level of consciousness: awake, alert  and patient cooperative  Complications: No apparent anesthesia complications

## 2014-09-18 NOTE — H&P (Signed)
The patient is seen today for planned wide excision of an area of DCIS involving the left breast.  She reports a 4 day history of increasing pain and swelling in the medial aspect of the right thigh. Spontaneous drainage occurred with marked improvement in her discomfort.  She underwent needle localization without incident.  No change in cardiopulmonary status  Examination of the right proximal medial thigh shows a 5 cm area of thickening without warmth or induration and a central 1 cm area of necrosis suggestive of a site of spontaneous drainage. Possibility of a spider bite is evident. No fluctuance necessitating incision and drainage at this time.  The patient will be given Kefzol intravenously as an embolic prophylaxis.

## 2014-09-18 NOTE — Anesthesia Preprocedure Evaluation (Signed)
Anesthesia Evaluation  Patient identified by MRN, date of birth, ID band Patient awake    Reviewed: Allergy & Precautions, NPO status , Patient's Chart, lab work & pertinent test results  History of Anesthesia Complications Negative for: history of anesthetic complications  Airway Mallampati: II       Dental no notable dental hx.    Pulmonary sleep apnea ,    Pulmonary exam normal       Cardiovascular negative cardio ROS Normal cardiovascular exam    Neuro/Psych negative neurological ROS  negative psych ROS   GI/Hepatic negative GI ROS, Neg liver ROS,   Endo/Other  negative endocrine ROSMorbid obesity  Renal/GU negative Renal ROS  negative genitourinary   Musculoskeletal negative musculoskeletal ROS (+)   Abdominal Normal abdominal exam  (+) + obese,   Peds negative pediatric ROS (+)  Hematology  (+) anemia ,   Anesthesia Other Findings   Reproductive/Obstetrics negative OB ROS                             Anesthesia Physical Anesthesia Plan  ASA: III  Anesthesia Plan: General   Post-op Pain Management:    Induction: Intravenous  Airway Management Planned: Oral ETT  Additional Equipment:   Intra-op Plan:   Post-operative Plan: Extubation in OR  Informed Consent: I have reviewed the patients History and Physical, chart, labs and discussed the procedure including the risks, benefits and alternatives for the proposed anesthesia with the patient or authorized representative who has indicated his/her understanding and acceptance.     Plan Discussed with: CRNA  Anesthesia Plan Comments:         Anesthesia Quick Evaluation

## 2014-09-18 NOTE — Progress Notes (Signed)
Discussed pt's status with Dr Bary Castilla via tele - states ok to d/c to home without his visit

## 2014-09-19 ENCOUNTER — Encounter: Payer: Self-pay | Admitting: General Surgery

## 2014-09-22 ENCOUNTER — Telehealth: Payer: Self-pay | Admitting: *Deleted

## 2014-09-22 ENCOUNTER — Telehealth: Payer: Self-pay

## 2014-09-22 NOTE — Telephone Encounter (Signed)
Spoke with patient about her dressing. She may shower. She may remove the waterproof dressing but is to leave the steri strips in place. Patient is scheduled to follow up here on 09/25/14. Patient is aware of directions.

## 2014-09-22 NOTE — Telephone Encounter (Signed)
Phoned patient to as post surgery follow-up.  States she is doing well, and will see Dr. Bary Castilla on 09/25/14.  Phoned [facility]  at state level, and [facility] forms are not through process at this time.  Phoned [place] to determine date of submission to state Medicaid. Oncology Nurse Navigator Documentation  Oncology Nurse Navigator Flowsheets 09/22/2014  Navigator Encounter Type Telephone  Patient Visit Type Surgery  Barriers/Navigation Needs No barriers at this time  Support Groups/Services -  Time Spent with Patient 15

## 2014-09-22 NOTE — Telephone Encounter (Signed)
Patient called and wanted to know if she can take a shower with the bandage still in place. She had a lumpectomy on 09/18/14 by Dr.Byrnett

## 2014-09-23 LAB — SURGICAL PATHOLOGY

## 2014-09-24 ENCOUNTER — Telehealth: Payer: Self-pay | Admitting: *Deleted

## 2014-09-24 NOTE — Telephone Encounter (Signed)
-----   Message from Robert Bellow, MD sent at 09/24/2014  7:46 AM EDT ----- Please notify the patient that the final report is received and is all good. No invasive cancer. Margins are all clear. Follow-up tomorrow as scheduled. ----- Message -----    From: [facility]    Sent: 09/22/2014   9:59 AM      To: Robert Bellow, MD

## 2014-09-24 NOTE — Telephone Encounter (Signed)
Notified patient as instructed, patient very pleased. Discussed follow-up appointments, patient agrees

## 2014-09-25 ENCOUNTER — Other Ambulatory Visit (INDEPENDENT_AMBULATORY_CARE_PROVIDER_SITE_OTHER): Payer: Medicaid Other

## 2014-09-25 ENCOUNTER — Encounter: Payer: Self-pay | Admitting: General Surgery

## 2014-09-25 ENCOUNTER — Ambulatory Visit: Payer: Medicaid Other | Admitting: General Surgery

## 2014-09-25 VITALS — BP 130/74 | HR 76 | Resp 14 | Ht 62.0 in | Wt 293.0 lb

## 2014-09-25 DIAGNOSIS — D0512 Intraductal carcinoma in situ of left breast: Secondary | ICD-10-CM | POA: Diagnosis not present

## 2014-09-25 NOTE — Patient Instructions (Addendum)
Patient to see Dr. Baruch Gouty for evaluation.

## 2014-09-25 NOTE — Progress Notes (Signed)
Patient ID: Felicia Ford, female   DOB: 07-Jan-1966, 49 y.o.   MRN: [id number]  Chief Complaint  Patient presents with  . Routine Post Op    left lumpectomy    HPI [name] is a 49 y.o. female here today for her post op left lumpectomy done on 09/18/14. Patient state she is doing well now. She states over the weekend had a lot of pain, but this is significantly resolved in the last 3 days. She is wearing her bra day and night for support. HPI  Past Medical History  Diagnosis Date  . Sleep apnea   . Anemia   . Elevated blood pressure     PT GOING TO [facility] 09-11-14 FOR RECHECK PF BP TO DETERMINE IT BP MEDS NEED TO BE STARTED  . Breast cancer 08/26/14    Left breast DCIS, ER positive, PR positive.    Past Surgical History  Procedure Laterality Date  . Breast biopsy Left 08/26/2014    DCIS  . Breast biopsy Left 09/18/2014    Procedure: BREAST BIOPSY WITH NEEDLE LOCALIZATION;  Surgeon: Robert Bellow, MD;  Location: [facility] ORS;  Service: General;  Laterality: Left;    Family History  Problem Relation Age of Onset  . Diabetes Mother   . Lupus Sister   . Lung cancer Maternal Uncle     Social History History  Substance Use Topics  . Smoking status: Never Smoker   . Smokeless tobacco: Never Used  . Alcohol Use: No    No Known Allergies  Current Outpatient Prescriptions  Medication Sig Dispense Refill  . HYDROcodone-acetaminophen (NORCO) 5-325 MG per tablet Take 1-2 tablets by mouth every 6 (six) hours as needed for moderate pain. 30 tablet 0   No current facility-administered medications for this visit.    Review of Systems Review of Systems  Constitutional: Negative.   Respiratory: Negative.   Cardiovascular: Negative.     Blood pressure 130/74, pulse 76, resp. rate 14, height 5\' 2"  (1.575 m), weight 293 lb (132.904 kg), last menstrual period 08/22/2014.  Physical Exam Physical Exam  Constitutional: She is oriented to person, place, and time. She  appears well-developed and well-nourished.  Eyes: Conjunctivae are normal. No scleral icterus.  Neck: Neck supple.  Cardiovascular: Normal rate, regular rhythm and normal heart sounds.   Pulmonary/Chest: Breath sounds normal.  Left breast incision looks clean and healing well.   Lymphadenopathy:    She has no cervical adenopathy.  Neurological: She is alert and oriented to person, place, and time.  Skin: Skin is warm and dry.    Data Reviewed Ultrasound examination of the breast was completed to determine if she would be a candidate for partial breast radiation. There is a 1.4 x 4.8 x 5.0 cm seroma cavity a minimum of 2.5 cm from the skin.  Assessment    Doing well status post wide excision of DCIS.  Excellent candidate for partial breast radiation based on her very generous breast volume.    Plan    The role of postoperative radiation was reviewed. Final decision for whole breast or partial breast radiation will be made by the radiation oncologist and consultation with the patient.  We'll arrange to see the patient after her assessment by radiation oncology.  Patient to see Dr. Baruch Gouty for evaluation.    Robert Bellow 09/26/2014, 8:01 PM

## 2014-09-26 ENCOUNTER — Encounter: Payer: Self-pay | Admitting: General Surgery

## 2014-09-30 ENCOUNTER — Ambulatory Visit: Payer: Self-pay | Admitting: Oncology

## 2014-10-01 ENCOUNTER — Encounter: Payer: Self-pay | Admitting: Radiation Oncology

## 2014-10-01 ENCOUNTER — Ambulatory Visit
Admission: RE | Admit: 2014-10-01 | Discharge: 2014-10-01 | Disposition: A | Discharge status: Home / Self Care | Payer: Medicaid Other | Source: Ambulatory Visit | Attending: Radiation Oncology | Admitting: Radiation Oncology

## 2014-10-01 ENCOUNTER — Telehealth: Payer: Self-pay | Admitting: *Deleted

## 2014-10-01 VITALS — BP 138/81 | HR 73 | Temp 98.7°F | Resp 20 | Wt 286.7 lb

## 2014-10-01 DIAGNOSIS — D0512 Intraductal carcinoma in situ of left breast: Secondary | ICD-10-CM | POA: Insufficient documentation

## 2014-10-01 DIAGNOSIS — Z510 Encounter for antineoplastic radiation therapy: Secondary | ICD-10-CM | POA: Insufficient documentation

## 2014-10-01 DIAGNOSIS — C50912 Malignant neoplasm of unspecified site of left female breast: Secondary | ICD-10-CM

## 2014-10-01 MED ORDER — CEFADROXIL 500 MG PO CAPS: 500.0000 mg | ORAL_CAPSULE | Freq: Two times a day (BID) | ORAL | Status: DC

## 2014-10-01 NOTE — Consult Note (Signed)
Except an outstanding is perfect of Radiation Oncology NEW PATIENT EVALUATION  Name: Felicia Ford  MRN: [id number]  Date:   10/01/2014     DOB: 02/14/1966   This 49 y.o. 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 Bary Castilla  CHIEF COMPLAINT:  Chief Complaint  Patient presents with  . Breast Cancer    Pt is here for initial consultation for breast cancer.  She is a possible candidate for mammosite radiation.      DIAGNOSIS: The encounter diagnosis was Malignant neoplasm of female breast, left.   PREVIOUS INVESTIGATIONS:  Surgical pathology report reviewed Mammograms reviewed Clinical notes reviewed  HPI: Patient is a pleasant 49 year old female who presented with an abnormal mammogram of her left breast showing a small group of microcalcifications in the upper outer quadrant with recommendation for left breast stereotactic guided core biopsy. Biopsy was positive for ductal carcinoma in situ. She went on to have a wide local excision for a 0.7 cm cluster of microcalcifications grade 2 with a small focus of necrosis. Margins were clear at greater than 1 cm. 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 Patient is extremely large breasts had been discussed with surgeon about accelerated partial breast irradiation. She is seen today for consultation. She is healing well and is without complaint. She works as a Armed forces technical officer.  PLANNED TREATMENT REGIMEN: Accelerated partial breast radiation using high direct dose rate remote afterloading  PAST MEDICAL HISTORY:  has a past medical history of Sleep apnea; Anemia; Elevated blood pressure; and Breast cancer (08/26/14).    PAST SURGICAL HISTORY:  Past Surgical History  Procedure Laterality Date  . Breast biopsy Left 08/26/2014    DCIS  . Breast biopsy Left 09/18/2014    Procedure: BREAST BIOPSY WITH NEEDLE LOCALIZATION;  Surgeon:  Robert Bellow, MD;  Location: [facility] ORS;  Service: General;  Laterality: Left;    FAMILY HISTORY: family history includes Diabetes in her mother; Lung cancer in her maternal uncle; Lupus in her sister.  SOCIAL HISTORY:  reports that she has never smoked. She has never used smokeless tobacco. She reports that she does not drink alcohol or use illicit drugs.  ALLERGIES: Review of patient's allergies indicates no known allergies.  MEDICATIONS:  Current Outpatient Prescriptions  Medication Sig Dispense Refill  . HYDROcodone-acetaminophen (NORCO) 5-325 MG per tablet Take 1-2 tablets by mouth every 6 (six) hours as needed for moderate pain. (Patient not taking: Reported on 10/01/2014) 30 tablet 0   No current facility-administered medications for this encounter.    ECOG PERFORMANCE STATUS:  0 - Asymptomatic  REVIEW OF SYSTEMS:  Patient denies any weight loss, fatigue, weakness, fever, chills or night sweats. Patient denies any loss of vision, blurred vision. Patient denies any ringing  of the ears or hearing loss. No irregular heartbeat. Patient denies heart murmur or history of fainting. Patient denies any chest pain or pain radiating to her upper extremities. Patient denies any shortness of breath, difficulty breathing at night, cough or hemoptysis. Patient denies any swelling in the lower legs. Patient denies any nausea vomiting, vomiting of blood, or coffee ground material in the vomitus. Patient denies any stomach pain. Patient states has had normal bowel movements no significant constipation or diarrhea. Patient denies any dysuria, hematuria or significant nocturia. Patient denies any problems walking, swelling in the joints or loss of balance. Patient denies any skin changes, loss of  hair or loss of weight. Patient denies any excessive worrying or anxiety or significant depression. Patient denies any problems with insomnia. Patient denies excessive thirst, polyuria, polydipsia. Patient denies  any swollen glands, patient denies easy bruising or easy bleeding. Patient denies any recent infections, allergies or URI. Patient "s visual fields have not changed significantly in recent time.    PHYSICAL EXAM: BP 138/81 mmHg  Pulse 73  Temp(Src) 98.7 F (37.1 C)  Resp 20  Wt 286 lb 11.3 oz (130.05 kg)  LMP 09/16/2014 Well-developed obese female in NAD with large pendulous breasts. No dominant mass or nodularity is noted in either breast in 2 positions examined. No axillary or supraclavicular adenopathy is noted. Well-developed well-nourished patient in NAD. HEENT reveals PERLA, EOMI, discs not visualized.  Oral cavity is clear. No oral mucosal lesions are identified. Neck is clear without evidence of cervical or supraclavicular adenopathy. Lungs are clear to A&P. Cardiac examination is essentially unremarkable with regular rate and rhythm without murmur rub or thrill. Abdomen is benign with no organomegaly or masses noted. Motor sensory and DTR levels are equal and symmetric in the upper and lower extremities. Cranial nerves II through XII are grossly intact. Proprioception is intact. No peripheral adenopathy or edema is identified. No motor or sensory levels are noted. Crude visual fields are within normal range.   LABORATORY DATA: Surgical pathology reports are reviewed    RADIOLOGY RESULTS: Mammograms and ultrasound reviewed   IMPRESSION: Ductal carcinoma in situ of the left breast and 49 year old female status post wide local excision ER/PR positive for adjuvant radiation therapy  PLAN: At this time although it is against ACR guidelines regarding accelerated partial breast radiation based on her [place] age I believe this would be a suitable alternative to whole breast radiation which would carry significant skin reaction and skin morbidity based on her large breast size. I believe it would be an attractive alternative to go ahead with accelerated partial breast irradiation to her left  breast treating at 2 3400 cGy in 10 fractions. I discussed the case personally with surgeon. Risks and benefits of treatment including permanent thickening in the lumpectomy site from scarring, fatigue, possible skin reaction, all were discussed in detail with the patient. Nurse navigator was present. Should the catheter not be able to be placed or is too close to the skin surface would go back and recommended whole breast radiation. We'll coordinate with surgeon catheter placement and BrachyVision treatment planning prior to initiating treatment. Patient will also be candidate for tamoxifen therapy after completion of radiation.  I would like to take this opportunity for allowing me to participate in the care of your patient.Armstead Peaks., MD

## 2014-10-01 NOTE — Telephone Encounter (Signed)
Mammosite schedule reviewed with the patient Placement   10-07-14  at [facility] Scan 10-10-14 Treat 7-18 to 7-22 RX sent for [place] will be calling her for more details  Aware no showers and to wear her bra while mammosite in place. Pt agrees.

## 2014-10-02 ENCOUNTER — Other Ambulatory Visit: Payer: Self-pay

## 2014-10-02 ENCOUNTER — Other Ambulatory Visit: Payer: Self-pay | Admitting: General Surgery

## 2014-10-02 NOTE — H&P (Signed)
Patient ID: Felicia Ford, female DOB: 07-08-1965, 49 y.o. MRN: [id number]  Chief Complaint  Patient presents with  . Routine Post Op    left lumpectomy    HPI Felicia Ford is a 49 y.o. female here today for her post op left lumpectomy done on 09/18/14. Patient state she is doing well now. She states over the weekend had a lot of pain, but this is significantly resolved in the last 3 days. She is wearing her bra day and night for support. HPI  Past Medical History  Diagnosis Date  . Sleep apnea   . Anemia   . Elevated blood pressure     PT GOING TO [facility] 09[facility]11-14 FOR RECHECK PF BP TO DETERMINE IT BP MEDS NEED TO BE STARTED  . Breast cancer 08/26/14    Left breast DCIS, ER positive, PR positive.    Past Surgical History  Procedure Laterality Date  . Breast biopsy Left 08/26/2014    DCIS  . Breast biopsy Left 09/18/2014    Procedure: BREAST BIOPSY WITH NEEDLE LOCALIZATION; Surgeon: Robert Bellow, MD; Location: [facility] ORS; Service: General; Laterality: Left;    Family History  Problem Relation Age of Onset  . Diabetes Mother   . Lupus Sister   . Lung cancer Maternal Uncle     Social History History  Substance Use Topics  . Smoking status: Never Smoker   . Smokeless tobacco: Never Used  . Alcohol Use: No    No Known Allergies  Current Outpatient Prescriptions  Medication Sig Dispense Refill  . HYDROcodone-acetaminophen (NORCO) 5-325 MG per tablet Take 1-2 tablets by mouth every 6 (six) hours as needed for moderate pain. 30 tablet 0   No current facility-administered medications for this visit.    Review of Systems Review of Systems  Constitutional: Negative.  Respiratory: Negative.  Cardiovascular: Negative.    Blood pressure 130/74, pulse 76, resp. rate 14, height 5\' 2"  (1.575 m), weight 293 lb (132.904 kg), last menstrual period  08/22/2014.  Physical Exam Physical Exam  Constitutional: She is oriented to person, place, and time. She appears well-developed and well-nourished.  Eyes: Conjunctivae are normal. No scleral icterus.  Neck: Neck supple.  Cardiovascular: Normal rate, regular rhythm and normal heart sounds.  Pulmonary/Chest: Breath sounds normal.  Left breast incision looks clean and healing well.  Lymphadenopathy:   She has no cervical adenopathy.  Neurological: She is alert and oriented to person, place, and time.  Skin: Skin is warm and dry.    Data Reviewed Ultrasound examination of the breast was completed to determine if she would be a candidate for partial breast radiation. There is a 1.4 x 4.8 x 5.0 cm seroma cavity a minimum of 2.5 cm from the skin.  Assessment    Doing well status post wide excision of DCIS.  Excellent candidate for partial breast radiation based on her very generous breast volume.    Plan    The role of postoperative radiation was reviewed. Final decision for whole breast or partial breast radiation will be made by the radiation oncologist and consultation with the patient.  We'll arrange to see the patient after her assessment by radiation oncology.  Patient to see Dr. Baruch Gouty for evaluation.    Robert Bellow 09/26/2014, 8:01 PM    PLAN: At this time although it is against ACR guidelines regarding accelerated partial breast radiation based on her [place] age I believe this would be a suitable alternative to whole breast radiation which  would carry significant skin reaction and skin morbidity based on her large breast size. I believe it would be an attractive alternative to go ahead with accelerated partial breast irradiation to her left breast treating at 2 3400 cGy in 10 fractions. I discussed the case personally with surgeon. Risks and benefits of treatment including permanent thickening in the lumpectomy site from scarring, fatigue, possible skin reaction,  all were discussed in detail with the patient. Nurse navigator was present. Should the catheter not be able to be placed or is too close to the skin surface would go back and recommended whole breast radiation. We'll coordinate with surgeon catheter placement and BrachyVision treatment planning prior to initiating treatment. Patient will also be candidate for tamoxifen therapy after completion of radiation.  I would like to take this opportunity for allowing me to participate in the care of your patient.Armstead Peaks., MD

## 2014-10-06 ENCOUNTER — Encounter: Payer: Self-pay | Admitting: *Deleted

## 2014-10-06 NOTE — Patient Instructions (Signed)
  Your procedure is scheduled on: 10-07-14 Report to [place] To find out your arrival time please call [phone] between 1PM - 3PM on 10-06-14  Remember: Instructions that are not followed completely may result in serious medical risk, up to and including death, or upon the discretion of your surgeon and anesthesiologist your surgery may need to be rescheduled.    _X___ 1. Do not eat food or drink liquids after midnight. No gum chewing or hard candies.     _X___ 2. No Alcohol for 24 hours before or after surgery.   ____ 3. Bring all medications with you on the day of surgery if instructed.    ____ 4. Notify your doctor if there is any change in your medical condition     (cold, fever, infections).     Do not wear jewelry, make-up, hairpins, clips or nail polish.  Do not wear lotions, powders, or perfumes. You may wear deodorant.  Do not shave 48 hours prior to surgery. Men may shave face and neck.  Do not bring valuables to the hospital.    [facility] is not responsible for any belongings or valuables.               Contacts, dentures or bridgework may not be worn into surgery.  Leave your suitcase in the car. After surgery it may be brought to your room.  For patients admitted to the hospital, discharge time is determined by your  treatment team.   Patients discharged the day of surgery will not be allowed to drive home.   Please read over the following fact sheets that you were given:      __X__ Take these medicines the morning of surgery with A SIP OF WATER:    1. DURICEF-PER DR BYRNETT  2.   3.   4.  5.  6.  ____ Fleet Enema (as directed)   ____ Use CHG Soap as directed  ____ Use inhalers on the day of surgery  ____ Stop metformin 2 days prior to surgery    ____ Take 1/2 of usual insulin dose the night before surgery and none on the morning of surgery.   ____ Stop Coumadin/Plavix/aspirin-N/A  ____ Stop  Anti-inflammatories   ____ Stop supplements until after surgery.    ____ Bring C-Pap to the hospital.

## 2014-10-07 ENCOUNTER — Encounter
Admission: RE | Disposition: A | Discharge status: Home / Self Care | Payer: Self-pay | Source: Ambulatory Visit | Attending: General Surgery

## 2014-10-07 ENCOUNTER — Encounter: Payer: Self-pay | Admitting: *Deleted

## 2014-10-07 ENCOUNTER — Encounter: Payer: Self-pay | Admitting: Anesthesiology

## 2014-10-07 ENCOUNTER — Ambulatory Visit
Admission: RE | Admit: 2014-10-07 | Discharge: 2014-10-07 | Disposition: A | Discharge status: Home / Self Care | Payer: Medicaid Other | Source: Ambulatory Visit | Attending: General Surgery | Admitting: General Surgery

## 2014-10-07 DIAGNOSIS — Z8489 Family history of other specified conditions: Secondary | ICD-10-CM | POA: Diagnosis not present

## 2014-10-07 DIAGNOSIS — I10 Essential (primary) hypertension: Secondary | ICD-10-CM | POA: Insufficient documentation

## 2014-10-07 DIAGNOSIS — Z833 Family history of diabetes mellitus: Secondary | ICD-10-CM | POA: Insufficient documentation

## 2014-10-07 DIAGNOSIS — C50911 Malignant neoplasm of unspecified site of right female breast: Secondary | ICD-10-CM | POA: Diagnosis present

## 2014-10-07 DIAGNOSIS — Z801 Family history of malignant neoplasm of trachea, bronchus and lung: Secondary | ICD-10-CM | POA: Insufficient documentation

## 2014-10-07 DIAGNOSIS — G4730 Sleep apnea, unspecified: Secondary | ICD-10-CM | POA: Insufficient documentation

## 2014-10-07 DIAGNOSIS — Z79899 Other long term (current) drug therapy: Secondary | ICD-10-CM | POA: Insufficient documentation

## 2014-10-07 DIAGNOSIS — D0511 Intraductal carcinoma in situ of right breast: Secondary | ICD-10-CM | POA: Insufficient documentation

## 2014-10-07 DIAGNOSIS — D0512 Intraductal carcinoma in situ of left breast: Secondary | ICD-10-CM | POA: Diagnosis not present

## 2014-10-07 DIAGNOSIS — Z923 Personal history of irradiation: Secondary | ICD-10-CM

## 2014-10-07 DIAGNOSIS — D649 Anemia, unspecified: Secondary | ICD-10-CM | POA: Diagnosis not present

## 2014-10-07 HISTORY — PX: BREAST MAMMOSITE: SHX5264

## 2014-10-07 HISTORY — DX: Personal history of irradiation: Z92.3

## 2014-10-07 LAB — POCT PREGNANCY, URINE: Preg Test, Ur: NEGATIVE

## 2014-10-07 SURGERY — MAMMOSITE BREAST
Anesthesia: Choice | Laterality: Left | Wound class: Clean

## 2014-10-07 MED ORDER — FAMOTIDINE 20 MG PO TABS
20.0000 mg | ORAL_TABLET | Freq: Once | ORAL | Status: AC
  Administered 2014-10-07: 20 mg via ORAL

## 2014-10-07 MED ORDER — IOHEXOL 180 MG/ML  SOLN
INTRAMUSCULAR | Status: AC
  Filled 2014-10-07: qty 40

## 2014-10-07 MED ORDER — BUPIVACAINE HCL (PF) 0.5 % IJ SOLN
INTRAMUSCULAR | Status: AC
Start: 2014-10-07 — End: 2014-10-07
  Filled 2014-10-07: qty 30

## 2014-10-07 MED ORDER — LIDOCAINE-EPINEPHRINE (PF) 1 %-1:200000 IJ SOLN
INTRAMUSCULAR | Status: DC | PRN
  Administered 2014-10-07: 10 mL

## 2014-10-07 MED ORDER — SODIUM CHLORIDE 0.9 % IV SOLN
INTRAVENOUS | Status: DC | PRN
  Administered 2014-10-07: 10 mL

## 2014-10-07 MED ORDER — LIDOCAINE-EPINEPHRINE (PF) 1 %-1:200000 IJ SOLN
INTRAMUSCULAR | Status: AC
  Filled 2014-10-07: qty 30

## 2014-10-07 MED ORDER — LIDOCAINE HCL (PF) 1 % IJ SOLN
INTRAMUSCULAR | Status: AC
  Filled 2014-10-07: qty 30

## 2014-10-07 MED ORDER — FAMOTIDINE 20 MG PO TABS
ORAL_TABLET | ORAL | Status: AC
  Administered 2014-10-07: 20 mg via ORAL
  Filled 2014-10-07: qty 1

## 2014-10-07 MED ORDER — SODIUM CHLORIDE 0.9 % IJ SOLN
INTRAMUSCULAR | Status: AC
  Filled 2014-10-07: qty 50

## 2014-10-07 MED ORDER — SODIUM BICARBONATE 4 % IV SOLN
INTRAVENOUS | Status: AC
Start: 2014-10-07 — End: 2014-10-07
  Filled 2014-10-07: qty 5

## 2014-10-07 MED ORDER — BACITRACIN ZINC 500 UNIT/GM EX OINT
TOPICAL_OINTMENT | CUTANEOUS | Status: AC
  Filled 2014-10-07: qty 28.35

## 2014-10-07 SURGICAL SUPPLY — 37 items
BASIN GRAD PLASTIC 32OZ STRL (MISCELLANEOUS) IMPLANT
CANISTER SUCT 1200ML W/VALVE (MISCELLANEOUS) ×3 IMPLANT
CHLORAPREP W/TINT 26ML (MISCELLANEOUS) ×3 IMPLANT
CLOSURE WOUND 1/2 X4 (GAUZE/BANDAGES/DRESSINGS)
CNTNR SPEC 2.5X3XGRAD LEK (MISCELLANEOUS)
CONT SPEC 4OZ STER OR WHT (MISCELLANEOUS)
CONTAINER SPEC 2.5X3XGRAD LEK (MISCELLANEOUS) IMPLANT
COVER PROBE FLX POLY STRL (MISCELLANEOUS) ×3 IMPLANT
DEVICE CAVITY EVALUATION 9031 (MISCELLANEOUS) ×3 IMPLANT
DEVICE DUBIN SPECIMEN MAMMOGRA (MISCELLANEOUS) IMPLANT
DRAPE LAPAROTOMY 100X77 ABD (DRAPES) ×3 IMPLANT
DRESSING TELFA 4X3 1S ST N-ADH (GAUZE/BANDAGES/DRESSINGS) IMPLANT
GAUZE SPONGE 4X4 12PLY STRL (GAUZE/BANDAGES/DRESSINGS) IMPLANT
GLOVE BIO SURGEON STRL SZ7.5 (GLOVE) ×6 IMPLANT
GLOVE INDICATOR 8.0 STRL GRN (GLOVE) ×6 IMPLANT
GOWN STRL REUS W/ TWL LRG LVL3 (GOWN DISPOSABLE) ×2 IMPLANT
GOWN STRL REUS W/TWL LRG LVL3 (GOWN DISPOSABLE) ×4
HARMONIC SCALPEL FOCUS (MISCELLANEOUS) IMPLANT
KIT RM TURNOVER STRD PROC AR (KITS) ×3 IMPLANT
LABEL OR SOLS (LABEL) IMPLANT
MARGIN MAP 10MM (MISCELLANEOUS) IMPLANT
NDL SAFETY 22GX1.5 (NEEDLE) IMPLANT
NDL SAFETY 25GX1.5 (NEEDLE) ×3 IMPLANT
NEEDLE HYPO 25GX1X1/2 BEV (NEEDLE) ×3 IMPLANT
NS IRRIG 500ML POUR BTL (IV SOLUTION) ×3 IMPLANT
PACK BASIN MINOR ARMC (MISCELLANEOUS) ×3 IMPLANT
PAD GROUND ADULT SPLIT (MISCELLANEOUS) ×3 IMPLANT
STRIP CLOSURE SKIN 1/2X4 (GAUZE/BANDAGES/DRESSINGS) IMPLANT
SUT ETHILON 3-0 FS-10 30 BLK (SUTURE)
SUT VIC AB 2-0 CT1 27 (SUTURE)
SUT VIC AB 2-0 CT1 TAPERPNT 27 (SUTURE) IMPLANT
SUT VIC AB 4-0 FS2 27 (SUTURE) IMPLANT
SUTURE EHLN 3-0 FS-10 30 BLK (SUTURE) IMPLANT
SWABSTK COMLB BENZOIN TINCTURE (MISCELLANEOUS) IMPLANT
SYR CONTROL 10ML (SYRINGE) IMPLANT
TOWEL OR 17X26 4PK STRL BLUE (TOWEL DISPOSABLE) IMPLANT
TRAY MAMMOSITE APPLI 4 5 CM (KITS) ×3 IMPLANT

## 2014-10-07 NOTE — Op Note (Signed)
Preoperative diagnosis: Right breast DCIS.  Postoperative diagnosis: Same.  Operative procedure: Placement of MammoSite balloon catheter for partial breast radiation.  Operating surgeon: Hervey Ard, M.D.  Anesthesia: 10 mL 1% Xylocaine with 1-200,000 of epinephrine.  Preoperative antibiotic: Duricef 500 mg preprocedure.  Operative note: The patient has been evaluated by radiation oncology and is felt to be a candidate for partial breast radiation.  The patient was identified and time out called. The breast was taped to the right to provide better exposure of the upper outer quadrant. Chlor prep was applied to the skin. Local and a steak was infiltrated. The cavity evaluation device was placed after draining 150 mL from the seroma cavity. This showed good positioning with a minimum of 1.5 cm between the balloon and the skin. The cavity evaluation device was removed and replaced with a treatment balloon inflated 70 mL of saline mixed with Omnipaque. Bacitracin was applied to the skin exit site followed by bulky dressing.  The patient tolerated procedure well and was taken to day surgery for discharge.

## 2014-10-07 NOTE — OR Nursing (Signed)
Patient to room 7 via stretcher from the OR;  Patient is a/o x 4; skin w/d; resp even and unlabored; patient has seen dr byrnett at this time and was instructed to go directly to his office when she leaves; pt has had no anesthesia; no iv site to assess; pt had local to the operative site; which is assessed without complication at the time of discharge. Gauze with papertape in place w/out drainage noted; pt discharged via wheelchair to her family.

## 2014-10-07 NOTE — Progress Notes (Signed)
Vitals taken during procedure. No sedation given. Dr. Bary Castilla aware of elevated BP.

## 2014-10-07 NOTE — Progress Notes (Signed)
Return to work note

## 2014-10-08 ENCOUNTER — Ambulatory Visit: Payer: Self-pay | Admitting: General Surgery

## 2014-10-09 ENCOUNTER — Ambulatory Visit (INDEPENDENT_AMBULATORY_CARE_PROVIDER_SITE_OTHER): Payer: Medicaid Other | Admitting: *Deleted

## 2014-10-09 DIAGNOSIS — D0512 Intraductal carcinoma in situ of left breast: Secondary | ICD-10-CM

## 2014-10-09 NOTE — Progress Notes (Signed)
Patient came in this morning and wanted a dressing change. Noticed just a small amount of  drainage , I applied two abdominal pads with some paper tape. Patient goes to the cancer center tomorrow.

## 2014-10-10 ENCOUNTER — Ambulatory Visit
Admission: RE | Admit: 2014-10-10 | Discharge: 2014-10-10 | Disposition: A | Discharge status: Home / Self Care | Payer: Medicaid Other | Source: Ambulatory Visit | Attending: Radiation Oncology | Admitting: Radiation Oncology

## 2014-10-10 ENCOUNTER — Encounter: Payer: Self-pay | Admitting: General Surgery

## 2014-10-10 DIAGNOSIS — Z510 Encounter for antineoplastic radiation therapy: Secondary | ICD-10-CM | POA: Diagnosis present

## 2014-10-10 DIAGNOSIS — D0512 Intraductal carcinoma in situ of left breast: Secondary | ICD-10-CM | POA: Diagnosis not present

## 2014-10-13 ENCOUNTER — Ambulatory Visit
Admission: RE | Admit: 2014-10-13 | Discharge: 2014-10-13 | Disposition: A | Discharge status: Home / Self Care | Payer: Medicaid Other | Source: Ambulatory Visit | Attending: Radiation Oncology | Admitting: Radiation Oncology

## 2014-10-13 ENCOUNTER — Ambulatory Visit: Payer: Medicaid Other

## 2014-10-13 DIAGNOSIS — Z510 Encounter for antineoplastic radiation therapy: Secondary | ICD-10-CM | POA: Diagnosis not present

## 2014-10-14 ENCOUNTER — Ambulatory Visit
Admission: RE | Admit: 2014-10-14 | Discharge: 2014-10-14 | Disposition: A | Discharge status: Home / Self Care | Payer: Medicaid Other | Source: Ambulatory Visit | Attending: Radiation Oncology | Admitting: Radiation Oncology

## 2014-10-14 DIAGNOSIS — Z510 Encounter for antineoplastic radiation therapy: Secondary | ICD-10-CM | POA: Diagnosis not present

## 2014-10-15 ENCOUNTER — Ambulatory Visit
Admission: RE | Admit: 2014-10-15 | Discharge: 2014-10-15 | Disposition: A | Discharge status: Home / Self Care | Payer: Medicaid Other | Source: Ambulatory Visit | Attending: Radiation Oncology | Admitting: Radiation Oncology

## 2014-10-15 DIAGNOSIS — Z510 Encounter for antineoplastic radiation therapy: Secondary | ICD-10-CM | POA: Diagnosis not present

## 2014-10-16 ENCOUNTER — Ambulatory Visit
Admission: RE | Admit: 2014-10-16 | Discharge: 2014-10-16 | Disposition: A | Discharge status: Home / Self Care | Payer: Medicaid Other | Source: Ambulatory Visit | Attending: Radiation Oncology | Admitting: Radiation Oncology

## 2014-10-16 DIAGNOSIS — Z510 Encounter for antineoplastic radiation therapy: Secondary | ICD-10-CM | POA: Diagnosis not present

## 2014-10-17 ENCOUNTER — Ambulatory Visit
Admission: RE | Admit: 2014-10-17 | Discharge: 2014-10-17 | Disposition: A | Discharge status: Home / Self Care | Payer: Medicaid Other | Source: Ambulatory Visit | Attending: Radiation Oncology | Admitting: Radiation Oncology

## 2014-10-17 DIAGNOSIS — Z510 Encounter for antineoplastic radiation therapy: Secondary | ICD-10-CM | POA: Diagnosis not present

## 2014-10-20 ENCOUNTER — Encounter: Payer: Self-pay | Admitting: *Deleted

## 2014-10-20 ENCOUNTER — Telehealth: Payer: Self-pay | Admitting: *Deleted

## 2014-10-20 NOTE — Telephone Encounter (Signed)
Needed work note for being absent during the dates of her mammosite treatments.    This letter was written and was faxed per the patient's request to [phone]

## 2014-11-26 ENCOUNTER — Encounter: Payer: Self-pay | Admitting: Radiation Oncology

## 2014-11-26 ENCOUNTER — Ambulatory Visit
Admission: RE | Admit: 2014-11-26 | Discharge: 2014-11-26 | Disposition: A | Discharge status: Home / Self Care | Payer: Medicaid Other | Source: Ambulatory Visit | Attending: Radiation Oncology | Admitting: Radiation Oncology

## 2014-11-26 VITALS — BP 142/91 | HR 76 | Temp 96.8°F | Resp 18 | Wt 288.4 lb

## 2014-11-26 DIAGNOSIS — C50912 Malignant neoplasm of unspecified site of left female breast: Secondary | ICD-10-CM

## 2014-11-26 NOTE — Progress Notes (Signed)
Radiation Oncology Follow up Note  Name: Felicia Ford   Date:   11/26/2014 MRN:  [id number] DOB: 1965/05/22    This 49 y.o. female presents to the clinic today for breast cancer status post accelerated partial breast irradiation to her left breast now 1 month out.  REFERRING PROVIDER: No ref. provider found  HPI: Patient is a pleasant 49 year old female now 1 month out having completed accelerated partial breast radiation to her left breast for ductal carcinoma in situ stage 0. 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 Patient has not started tamoxifen yet has not had a follow-up appointment with surgeon we are arranging that. She is doing well she specifically denies breast tenderness cough or bone pain..  COMPLICATIONS OF TREATMENT: none  FOLLOW UP COMPLIANCE: keeps appointments   PHYSICAL EXAM:  BP 142/91 mmHg  Pulse 76  Temp(Src) 96.8 F (36 C)  Resp 18  Wt 288 lb 5.8 oz (130.8 kg) Well-developed obese female in NAD. Breasts are extremely large. Lungs are clear to A&P cardiac examination essentially unremarkable with regular rate and rhythm. No dominant mass or nodularity is noted in either breast in 2 positions examined. Incision is well-healed. No axillary or supraclavicular adenopathy is appreciated. Cosmetic result is excellent. Well-developed well-nourished patient in NAD. HEENT reveals PERLA, EOMI, discs not visualized.  Oral cavity is clear. No oral mucosal lesions are identified. Neck is clear without evidence of cervical or supraclavicular adenopathy. Lungs are clear to A&P. Cardiac examination is essentially unremarkable with regular rate and rhythm without murmur rub or thrill. Abdomen is benign with no organomegaly or masses noted. Motor sensory and DTR levels are equal and symmetric in the upper and lower extremities. Cranial nerves II through XII are grossly intact. Proprioception is intact. No peripheral adenopathy or edema is identified. No motor or sensory levels are noted. Crude  visual fields are within normal range.   RADIOLOGY RESULTS: No current films for review  PLAN: Present time she is doing 1 month out from accelerated partial breast irradiation. I like to start tamoxifen have set up an appointment with her surgeon for follow-up. Otherwise I'm please were overall progress I've asked to see her back in 4-5 months for follow-up. Patient is to call sooner with any concerns.  I would like to take this opportunity for allowing me to participate in the care of your patient.Armstead Peaks., MD

## 2014-12-02 ENCOUNTER — Ambulatory Visit (INDEPENDENT_AMBULATORY_CARE_PROVIDER_SITE_OTHER): Payer: Medicaid Other | Admitting: General Surgery

## 2014-12-02 ENCOUNTER — Encounter: Payer: Self-pay | Admitting: General Surgery

## 2014-12-02 VITALS — BP 124/82 | HR 81 | Resp 16 | Ht 62.0 in | Wt 288.2 lb

## 2014-12-02 DIAGNOSIS — D0510 Intraductal carcinoma in situ of unspecified breast: Secondary | ICD-10-CM | POA: Insufficient documentation

## 2014-12-02 DIAGNOSIS — D0512 Intraductal carcinoma in situ of left breast: Secondary | ICD-10-CM

## 2014-12-02 MED ORDER — TAMOXIFEN CITRATE 20 MG PO TABS: 20.0000 mg | ORAL_TABLET | Freq: Every day | ORAL | Status: DC

## 2014-12-02 NOTE — Progress Notes (Signed)
Patient ID: DAIJHA Ford, female   DOB: 07/27/1965, 49 y.o.   MRN: [id number]  Chief Complaint  Patient presents with  . Follow-up    mammosite placement    [name] is a 49 y.o. female.  She is here today for a follow up from a left side mammosite. Patient has no pain. She states she tolerated the radiation well.  The incision site looks well, RUQ wounds doing great. She still senses the breast as being heavy. She is wearing a bra at night.  HPI  Past Medical History  Diagnosis Date  . Sleep apnea   . Anemia   . Elevated blood pressure     PT GOING TO [facility] 09-11[facility]14 FOR RECHECK PF BP TO DETERMINE IT BP MEDS NEED TO BE STARTED  . Breast cancer 08/26/14    Left breast DCIS, ER positive, PR positive.    Past Surgical History  Procedure Laterality Date  . Breast biopsy Left 08/26/2014    DCIS  . Breast biopsy Left 09/18/2014    Procedure: BREAST BIOPSY WITH NEEDLE LOCALIZATION;  Surgeon: Robert Bellow, MD;  Location: [facility] ORS;  Service: General;  Laterality: Left;  . Breast mammosite Left 10/07/2014    Procedure: MAMMOSITE BREAST;  Surgeon: Robert Bellow, MD;  Location: [facility] ORS;  Service: General;  Laterality: Left;    Family History  Problem Relation Age of Onset  . Diabetes Mother   . Lupus Sister   . Lung cancer Maternal Uncle     Social History Social History  Substance Use Topics  . Smoking status: Never Smoker   . Smokeless tobacco: Never Used  . Alcohol Use: No    No Known Allergies  Current Outpatient Prescriptions  Medication Sig Dispense Refill  . tamoxifen (NOLVADEX) 20 MG tablet Take 1 tablet (20 mg total) by mouth daily. 30 tablet 11   No current facility-administered medications for this visit.    Review of Systems Review of Systems  Constitutional: Negative.   Respiratory: Negative.   Cardiovascular: Negative.     Blood pressure 124/82, pulse 81, resp. rate 16, height 5\' 2"  (1.575 m), weight 288 lb 3.2 oz (130.727 kg),  last menstrual period 11/29/2014, SpO2 99 %.  Physical Exam Physical Exam  Pulmonary/Chest:      Data Reviewed ER/PR positive DCIS.  Assessment    Doing well status post accelerated partial breast radiation.    Plan    Indication for antiestrogen therapy reviewed. Patient is still having monthly menses and therefore no risk for uterine cancer.  DVT risk reviewed. Potential for vasomotor symptoms discussed.  The patient will have a follow-up left breast diagnostic mammogram and office visit in December 2016.    Start taking Tomoxifen. 1X daily for 1 month. The 2-3 weeks will be the worst and then get better. In 1 month call office with a report on how the medication is doing.  Follow up in December PCP: [facility] 12/02/2014, 2:55 PM

## 2014-12-03 ENCOUNTER — Telehealth: Payer: Self-pay

## 2014-12-03 NOTE — Telephone Encounter (Signed)
-----   Message from Robert Bellow, MD sent at 12/02/2014  2:57 PM EDT ----- Please asked the patient to take a daily baby aspirin with her tamoxifen. Thank you

## 2014-12-03 NOTE — Telephone Encounter (Signed)
Notified patient as instructed, patient pleased and will take a daily baby aspirin.

## 2014-12-19 ENCOUNTER — Telehealth: Payer: Self-pay | Admitting: General Surgery

## 2014-12-19 NOTE — Telephone Encounter (Signed)
PT CALLED TODAY TO GIVE HER REPORT ON TAKING TAMOXIFEN.SHE STATES SHE IS HAVING A RACING HEART & IS UNABLE TO SLEEP AT NIGHT DUE TO ANXIETY. SHE'S UNABLE ABLE TO DESCRIBE THE FEELING.HER LAST APPT WAS 12-02-14 & IS RECALLS FOR December 2016.

## 2014-12-22 NOTE — Telephone Encounter (Signed)
Please contact the patient and see if she is still having menstrual periods. She should stop the tamoxifen, we'll look at other options for hormone suppression based on her history.

## 2014-12-22 NOTE — Telephone Encounter (Signed)
Spoke with patient and she says that she is still having her periods. I let her know to stop the Tamoxifen for now and that Dr Bary Castilla would look into other hormone suppression therapy and get back with her.

## 2015-01-07 NOTE — Telephone Encounter (Signed)
I spoke w/ medical oncology. Unlikely reaction to Tamoxifen.  No alternatives for a woman still with active menses. Lupron/ oophorectomy with significant cost/ risk for DCIS.  Will arrange OV to discuss.

## 2015-01-16 ENCOUNTER — Other Ambulatory Visit: Payer: Self-pay

## 2015-01-16 DIAGNOSIS — D0512 Intraductal carcinoma in situ of left breast: Secondary | ICD-10-CM

## 2015-01-21 ENCOUNTER — Ambulatory Visit (INDEPENDENT_AMBULATORY_CARE_PROVIDER_SITE_OTHER): Payer: Medicaid Other | Admitting: General Surgery

## 2015-01-21 ENCOUNTER — Encounter: Payer: Self-pay | Admitting: General Surgery

## 2015-01-21 VITALS — BP 130/80 | HR 70 | Resp 16 | Ht 62.0 in | Wt 286.0 lb

## 2015-01-21 DIAGNOSIS — D0512 Intraductal carcinoma in situ of left breast: Secondary | ICD-10-CM | POA: Diagnosis not present

## 2015-01-21 DIAGNOSIS — N951 Menopausal and female climacteric states: Secondary | ICD-10-CM | POA: Diagnosis not present

## 2015-01-21 NOTE — Patient Instructions (Addendum)
Recommend Tamoxifen, try and start with 1/2 tablet for a week then 1/2 tablet twice a day with a goal of one tablet daily. Call in one month with status update. Dr Tula Nakayama Psychiatric nurse.

## 2015-01-21 NOTE — Progress Notes (Signed)
Patient ID: Felicia Ford, female   DOB: 01-28-1966, 49 y.o.   MRN: [id number]  Chief Complaint  Patient presents with  . Follow-up    HPI Felicia Ford is a 49 y.o. female.  Here today to discuss hormone suppression therapy. The patient called to report a racing heart and other symptoms when she started her tamoxifen. Included in this was some difficulty with sleep. These of all gradually improved, but she discontinue the medications.  The patient reports she's had long-standing bilateral breast discomfort due to the size of her breasts. She makes use of 3 brought to provide adequate support. She has noted moderate decrease in the left breast side since wide excision and radiation treatment. She brought up the possibility of breast reduction.   She does states that her breast "they just hurt" they are so bid and her back is starting to bother her as well. She wears a sports bra for comfort.  The clinical history was confirmed with the patient during her visit.  HPI  Past Medical History  Diagnosis Date  . Sleep apnea   . Anemia   . Elevated blood pressure     PT GOING TO [facility] 09-11-14 FOR RECHECK PF BP TO DETERMINE IT BP MEDS NEED TO BE STARTED  . Breast cancer ([place]) 08/26/14    Left breast DCIS, ER positive, PR positive.    Past Surgical History  Procedure Laterality Date  . Breast biopsy Left 08/26/2014    DCIS  . Breast biopsy Left 09/18/2014    Procedure: BREAST BIOPSY WITH NEEDLE LOCALIZATION;  Surgeon: Robert Bellow, MD;  Location: [facility] ORS;  Service: General;  Laterality: Left;  . Breast mammosite Left 10/07/2014    Procedure: MAMMOSITE BREAST;  Surgeon: Robert Bellow, MD;  Location: [facility] ORS;  Service: General;  Laterality: Left;    Family History  Problem Relation Age of Onset  . Diabetes Mother   . Lupus Sister   . Lung cancer Maternal Uncle     Social History Social History  Substance Use Topics  . Smoking status: Never Smoker   . Smokeless  tobacco: Never Used  . Alcohol Use: No    No Known Allergies  No current outpatient prescriptions on file.   No current facility-administered medications for this visit.    Review of Systems Review of Systems  Constitutional: Negative.   Respiratory: Negative.   Cardiovascular: Negative.     Blood pressure 130/80, pulse 70, resp. rate 16, height 5\' 2"  (1.575 m), weight 286 lb (129.729 kg), last menstrual period 12/27/2014.  Physical Exam Physical Exam  Constitutional: She is oriented to person, place, and time. She appears well-developed and well-nourished.  Pulmonary/Chest:    The left breast is perhaps 10% smaller than the right. Deep grooving in the shoulders as noted bilaterally.    Neurological: She is alert and oriented to person, place, and time.  Skin: Skin is warm and dry.  Psychiatric: Her behavior is normal.    Data Reviewed Pathology showed a 7 mm area of DCIS.  Assessment    ER positive DCIS.  Vasomotor symptoms, possibly related to tamoxifen.  Significant breast discomfort secondary to massive breast volume.    Plan    We'll arrange for a plastic surgery consultation.       Recommend Tamoxifen, she is willing to try and start with 1/2 tablet for a week then 1/2 tablet twice a day with a goal of one tablet daily provided she tolerates.  Call in a month with progress report. Reasonable to refer to plastic surgeon for discussion of breast reduction.  PCP:  No Pcp   Robert Bellow 01/21/2015, 8:29 PM

## 2015-01-22 ENCOUNTER — Telehealth: Payer: Self-pay | Admitting: *Deleted

## 2015-01-22 NOTE — Telephone Encounter (Signed)
Patient called the office back and was notified accordingly.   She will do some checking around of her own as she feels that Medicaid will pay for this procedure if she is having pain. Please note: Dr. Edwin Dada office and [place] were contacted and will not see this patient for a breast reduction.  Patient will notify the office if she would like to be seen at [facility]. If so, a referral form will need to be faxed in along with records and they will contact patient regarding an appointment.

## 2015-01-22 NOTE — Telephone Encounter (Signed)
-----   Message from Robert Bellow, MD sent at 01/21/2015  8:32 PM EDT ----- The patient has Medicaid insurance. See where we can refer her for consideration of breast reduction. Thank you

## 2015-01-22 NOTE — Telephone Encounter (Signed)
Message for patient to call the office.   Medicaid will not cover for breast reduction; however, patient could be seen at [facility] for consultation. This would be $125 out of pocket. If patient desires breast reduction, the charge is $5,650 and she would need to pay this up front prior to surgery.

## 2015-02-03 ENCOUNTER — Ambulatory Visit: Payer: Medicaid Other | Admitting: General Surgery

## 2015-03-24 ENCOUNTER — Other Ambulatory Visit: Payer: Self-pay | Admitting: General Surgery

## 2015-03-24 ENCOUNTER — Ambulatory Visit
Admission: RE | Admit: 2015-03-24 | Discharge: 2015-03-24 | Disposition: A | Discharge status: Home / Self Care | Payer: Medicaid Other | Source: Ambulatory Visit | Attending: General Surgery | Admitting: General Surgery

## 2015-03-24 DIAGNOSIS — Z9889 Other specified postprocedural states: Secondary | ICD-10-CM | POA: Insufficient documentation

## 2015-03-24 DIAGNOSIS — D0512 Intraductal carcinoma in situ of left breast: Secondary | ICD-10-CM

## 2015-03-24 DIAGNOSIS — Z853 Personal history of malignant neoplasm of breast: Secondary | ICD-10-CM | POA: Insufficient documentation

## 2015-03-27 ENCOUNTER — Other Ambulatory Visit: Payer: Self-pay | Admitting: Obstetrics and Gynecology

## 2015-03-27 ENCOUNTER — Encounter: Payer: Self-pay | Admitting: Obstetrics and Gynecology

## 2015-03-27 ENCOUNTER — Ambulatory Visit (INDEPENDENT_AMBULATORY_CARE_PROVIDER_SITE_OTHER): Payer: Medicaid Other | Admitting: Obstetrics and Gynecology

## 2015-03-27 ENCOUNTER — Ambulatory Visit
Admission: RE | Admit: 2015-03-27 | Discharge: 2015-03-27 | Disposition: A | Discharge status: Home / Self Care | Payer: Medicaid Other | Source: Ambulatory Visit | Attending: Obstetrics and Gynecology | Admitting: Obstetrics and Gynecology

## 2015-03-27 ENCOUNTER — Telehealth: Payer: Self-pay | Admitting: *Deleted

## 2015-03-27 VITALS — BP 139/80 | HR 80 | Ht 62.0 in | Wt 281.8 lb

## 2015-03-27 DIAGNOSIS — Z5181 Encounter for therapeutic drug level monitoring: Secondary | ICD-10-CM | POA: Diagnosis not present

## 2015-03-27 DIAGNOSIS — Z79810 Long term (current) use of selective estrogen receptor modulators (SERMs): Secondary | ICD-10-CM

## 2015-03-27 DIAGNOSIS — N920 Excessive and frequent menstruation with regular cycle: Secondary | ICD-10-CM

## 2015-03-27 DIAGNOSIS — D0512 Intraductal carcinoma in situ of left breast: Secondary | ICD-10-CM | POA: Diagnosis not present

## 2015-03-27 DIAGNOSIS — D259 Leiomyoma of uterus, unspecified: Secondary | ICD-10-CM | POA: Diagnosis not present

## 2015-03-27 DIAGNOSIS — N939 Abnormal uterine and vaginal bleeding, unspecified: Secondary | ICD-10-CM | POA: Diagnosis not present

## 2015-03-27 DIAGNOSIS — Z853 Personal history of malignant neoplasm of breast: Secondary | ICD-10-CM | POA: Insufficient documentation

## 2015-03-27 DIAGNOSIS — Z793 Long term (current) use of hormonal contraceptives: Secondary | ICD-10-CM

## 2015-03-27 MED ORDER — MEDROXYPROGESTERONE ACETATE 10 MG PO TABS: 10.0000 mg | ORAL_TABLET | Freq: Every day | ORAL | Status: DC

## 2015-03-27 MED ORDER — NAPROXEN 500 MG PO TABS: 500.0000 mg | ORAL_TABLET | Freq: Two times a day (BID) | ORAL | Status: DC

## 2015-03-27 NOTE — Progress Notes (Signed)
GYNECOLOGY CLINIC PROGRESS NOTE  Subjective:     Felicia Ford is an 49 y.o. P75 female who presents for prolonged menses.  Referred from Dr. Barbette Hair office [facility]) as patient currently on Tamoxifen therapy (has been using x 3 months s/p left breast lumpectomy for DCIS in 08/2014, ER+/PR+).  Patient notes that her menstrual cycle began on 03/12/15, and has been ongoing x 2 weeks, becoming heavier.  Is currently having to change a pad ever 1.5 hours. Denies previous h/o irregular bleeding episodes. Denies pelvic pain/dysmenorrhea.  Denies SOB, dizziness, fatigue, chest pain. Does note passage of small clots.   History of abnormal Pap smear: no.  Menstrual History: OB History    Gravida Para Term Preterm AB TAB SAB Ectopic Multiple Living   3 3        3       Menarche age: 34 Patient's last menstrual period was 03/19/2015.  Last pap smear was 08/2014 (performed at [facility]), normal.  Denies h/o STIs.    Past Medical History  Diagnosis Date  . Sleep apnea   . Anemia   . Elevated blood pressure     PT GOING TO [facility] 09[facility]11-14 FOR RECHECK PF BP TO DETERMINE IT BP MEDS NEED TO BE STARTED  . Breast cancer ([place]) 08/26/14    Left breast DCIS, ER positive, PR positive.    Past Surgical History  Procedure Laterality Date  . Breast biopsy Left 08/26/2014    DCIS  . Breast biopsy Left 09/18/2014    Procedure: BREAST BIOPSY WITH NEEDLE LOCALIZATION;  Surgeon: Robert Bellow, MD;  Location: [facility] ORS;  Service: General;  Laterality: Left;  . Breast mammosite Left 10/07/2014    Procedure: MAMMOSITE BREAST;  Surgeon: Robert Bellow, MD;  Location: [facility] ORS;  Service: General;  Laterality: Left;  . Breast lumpectomy Left 08/2014    DCIS   Family History  Problem Relation Age of Onset  . Diabetes Mother   . Lupus Sister   . Lung cancer Maternal Uncle   . Breast cancer Neg Hx     Social History  Substance Use Topics  . Smoking status: Never Smoker   . Smokeless tobacco: Never  Used  . Alcohol Use: No    Outpatient Encounter Prescriptions as of 03/27/2015  Medication Sig Note  . aspirin 81 MG tablet Take 81 mg by mouth daily.   . tamoxifen (NOLVADEX) 20 MG tablet  03/27/2015: Received from: External Pharmacy  . naproxen (NAPROSYN) 500 MG tablet Take 1 tablet (500 mg total) by mouth 2 (two) times daily with a meal. During 1st 7 days of menses.    No facility-administered encounter medications on file as of 03/27/2015.    No Known Allergies   Review of Systems Pertinent items noted in HPI and remainder of comprehensive ROS otherwise negative.    Objective:    BP 139/80 mmHg  Pulse 80  Ht 5\' 2"  (1.575 m)  Wt 281 lb 12.8 oz (127.824 kg)  BMI 51.53 kg/m2  LMP 03/19/2015  General:   alert and no distress, morbidly obese  Skin:    normal and no rash or abnormalities  Abdomen:  soft, non-tender; bowel sounds normal; no masses,  no organomegaly  Pelvic:   external genitalia normal, without lesions.  Vagina with no lesions, normal mucosa, moderate amount of dark red blood in vaginal vault. Cervix with several Nabothian cysts present, no discharge.Uterine size difficult to assess due to body habitus.  Adnexae non-palpable, nontender.  Extremities:  Extremities: extremities normal, atraumatic, no cyanosis or edema  Neurologic:  Grossly normal     Assessment:    Abnormal bleeding on chemotherapy (Tamoxifen)   Morbid obesity   Plan:    All questions answered. Blood tests: CBC with diff. Endometrial biopsy - see separate procedure note. Pelvic ultrasound.   Patient has abnormal uterine bleeding. She has a normal exam, no evidence of lesions.  Will order abnormal uterine bleeding evaluation labs and pelvic ultrasound to evaluate for any structural gynecologic abnormalities.  Patient to f/u in 1 week for these results and plans for further evaluation/management.  Lengthy discussion had with patient on effects of Tamoxifen on endometrium, as patient notes that  she was unware that abnormal bleeding was a side effect.  Also discussed potential management options with patient.  As this is her first episode of heavy/prolonged bleeding on Tamoxifen, would recommend treatment such as NSAIDs or Lysteda (low risk of increasing thromboembolic events).  If multiple episodes or subsequent prolonged menstrual cycles occur, may be a candidate for endometrial ablation. If endometrial polyp present on ultrasound, would recommend Hysteroscopy D&C.  If endometrial cancer diagnosed, hysterectomy would be the recommendation.  Discussed possibility of use of Mirena IUD for endometrial protection while on Tamoxifen.  Conflicting studies on whether local progesterone therapy would slightly increase risk of breast cancer recurrence.  To discuss all options again further at next visit after results reviewed.     Endometrial Biopsy Procedure Note  The patient is positioned on the exam table in the dorsal lithotomy position. Bimanual exam confirms uterine position and size. A Graves speculum is placed into the vagina. A single toothed tenaculum is placed onto the anterior lip of the cervix. The pipette is placed into the endocervical canal and is advanced to the uterine fundus. Using a piston like technique, with vacuum created by withdrawing the stylus, the endometrial specimen is obtained and transferred to the biopsy container. Minimal bleeding is encountered. The procedure is well tolerated.   Uterine Position:mid anterior posterior   Uterine Length: 11 cm   Uterine Specimen: Average   Post procedure instructions are given. The patient is scheduled for follow up appointment.    A total of 45 minutes were spent face-to-face with the patient during the encounter with greater than 50% dealing with counseling and coordination of care.   Rubie Maid, MD [facility]

## 2015-03-27 NOTE — Telephone Encounter (Signed)
Patient called today stating that she has been having a period since 2 weeks, she said its not like her normal period, its more heavy, thinks this is coming from the tamoxifen pills.

## 2015-03-27 NOTE — Telephone Encounter (Signed)
Patient is doing to be seen today at [facility] with Rubie Maid, MD today at 11:30

## 2015-03-27 NOTE — Patient Instructions (Addendum)
Begin taking Naproxen tablets twice daily for 7 days during menstrual cycles. To f/u in 1 week.

## 2015-03-28 LAB — CBC
Hematocrit: 34.8 % (ref 34.0–46.6)
Hemoglobin: 11.8 g/dL (ref 11.1–15.9)
MCH: 32.1 pg (ref 26.6–33.0)
MCHC: 33.9 g/dL (ref 31.5–35.7)
MCV: 95 fL (ref 79–97)
PLATELETS: 292 10*3/uL (ref 150–379)
RBC: 3.68 x10E6/uL — AB (ref 3.77–5.28)
RDW: 14.7 % (ref 12.3–15.4)
WBC: 8.3 10*3/uL (ref 3.4–10.8)

## 2015-03-31 ENCOUNTER — Ambulatory Visit (INDEPENDENT_AMBULATORY_CARE_PROVIDER_SITE_OTHER): Payer: Medicaid Other | Admitting: General Surgery

## 2015-03-31 ENCOUNTER — Encounter: Payer: Self-pay | Admitting: General Surgery

## 2015-03-31 VITALS — BP 128/74 | HR 76 | Resp 12 | Ht 62.0 in | Wt 281.0 lb

## 2015-03-31 DIAGNOSIS — Z79810 Long term (current) use of selective estrogen receptor modulators (SERMs): Secondary | ICD-10-CM | POA: Insufficient documentation

## 2015-03-31 DIAGNOSIS — N920 Excessive and frequent menstruation with regular cycle: Secondary | ICD-10-CM | POA: Insufficient documentation

## 2015-03-31 DIAGNOSIS — D0512 Intraductal carcinoma in situ of left breast: Secondary | ICD-10-CM

## 2015-03-31 NOTE — Progress Notes (Signed)
Patient ID: Felicia Ford, female   DOB: 1965/05/16, 50 y.o.   MRN: [id number]  Chief Complaint  Patient presents with  . Follow-up    left mammogram    HPI Felicia Ford is a 50 y.o. female.  Here today for follow up left breast cancer. Her mammogram was 03-24-15. No new breast issues. Patient was seen at [facility] on 03/27/15. She underwent endometrial biopsy, pathology is pending at the time of today's visit.  The patient had restarted tamoxifen approximate 3 months ago, starting at 10 mg per day and advancing to 20 mg per day. She was tolerating this very well until her episode of prolonged menstrual bleeding that occurred the end of December.  The patient reports that she took 1 dose of the Provera, and did not take additional doses. The heavy bleeding has subsided.  I personally reviewed the patient's history. HPI  Past Medical History  Diagnosis Date  . Sleep apnea   . Anemia   . Elevated blood pressure     PT GOING TO [facility] 09-11-14 FOR RECHECK PF BP TO DETERMINE IT BP MEDS NEED TO BE STARTED  . Breast cancer ([place]) 08/26/14    Left breast DCIS, ER positive, PR positive.    Past Surgical History  Procedure Laterality Date  . Breast biopsy Left 08/26/2014    DCIS  . Breast biopsy Left 09/18/2014    Procedure: BREAST BIOPSY WITH NEEDLE LOCALIZATION;  Surgeon: Robert Bellow, MD;  Location: [facility] ORS;  Service: General;  Laterality: Left;  . Breast mammosite Left 10/07/2014    Procedure: MAMMOSITE BREAST;  Surgeon: Robert Bellow, MD;  Location: [facility] ORS;  Service: General;  Laterality: Left;  . Breast lumpectomy Left 08/2014    DCIS    Family History  Problem Relation Age of Onset  . Diabetes Mother   . Lupus Sister   . Lung cancer Maternal Uncle   . Breast cancer Neg Hx     Social History Social History  Substance Use Topics  . Smoking status: Never Smoker   . Smokeless tobacco: Never Used  . Alcohol Use: No    No Known Allergies  Current  Outpatient Prescriptions  Medication Sig Dispense Refill  . aspirin 81 MG tablet Take 81 mg by mouth daily.    . naproxen (NAPROSYN) 500 MG tablet Take 1 tablet (500 mg total) by mouth 2 (two) times daily with a meal. During 1st 7 days of menses. 60 tablet 2  . tamoxifen (NOLVADEX) 20 MG tablet   10   No current facility-administered medications for this visit.    Review of Systems Review of Systems  Constitutional: Negative.   Respiratory: Negative.   Cardiovascular: Negative.     Blood pressure 128/74, pulse 76, resp. rate 12, height 5\' 2"  (1.575 m), weight 281 lb (127.461 kg), last menstrual period 03/10/2015.  Physical Exam Physical Exam  Constitutional: She is oriented to person, place, and time. She appears well-developed and well-nourished.  Eyes: Conjunctivae are normal. No scleral icterus.  Neck: Neck supple.  Cardiovascular: Normal rate, regular rhythm and normal heart sounds.   Pulmonary/Chest: Effort normal and breath sounds normal.    Left breast well healed incision.   Lymphadenopathy:    She has no cervical adenopathy.  Neurological: She is alert and oriented to person, place, and time.  Skin: Skin is warm and dry.    Data Reviewed 03/24/2015 left breast mammogram showed postsurgical changes. No new microcalcifications. BI-RADS-2.  Assessment  Benign breast exam. Resolution of menorrhagia.    Plan    Email communication with Dr. Marcelline Mates was affirmative for reinstitution of tamoxifen. As the patient is only missed a few doses she'll go back to 20 mg daily. She will follow-up with Dr. Marcelline Mates as previously arranged regarding her endometrial biopsy.    Patient to return on six months bilateral diagnotic mammogram. PCP:  No Pcp  This information has been scribed by Karie Fetch RNBC.   Robert Bellow 03/31/2015, 8:18 PM

## 2015-03-31 NOTE — Patient Instructions (Addendum)
The patient is aware to call back for any questions or concerns. Patient to return on six months bilateral diagnotic mammogram.

## 2015-04-01 LAB — PATHOLOGY

## 2015-04-15 ENCOUNTER — Ambulatory Visit: Payer: Medicaid Other | Admitting: Obstetrics and Gynecology

## 2015-04-21 ENCOUNTER — Encounter: Payer: Self-pay | Admitting: Obstetrics and Gynecology

## 2015-04-21 ENCOUNTER — Ambulatory Visit (INDEPENDENT_AMBULATORY_CARE_PROVIDER_SITE_OTHER): Payer: Medicaid Other | Admitting: Obstetrics and Gynecology

## 2015-04-21 VITALS — BP 114/79 | HR 91 | Wt 283.6 lb

## 2015-04-21 DIAGNOSIS — N920 Excessive and frequent menstruation with regular cycle: Secondary | ICD-10-CM | POA: Diagnosis not present

## 2015-04-21 DIAGNOSIS — D250 Submucous leiomyoma of uterus: Secondary | ICD-10-CM | POA: Diagnosis not present

## 2015-04-21 DIAGNOSIS — N83209 Unspecified ovarian cyst, unspecified side: Secondary | ICD-10-CM

## 2015-04-21 DIAGNOSIS — N83291 Other ovarian cyst, right side: Secondary | ICD-10-CM | POA: Diagnosis not present

## 2015-04-21 DIAGNOSIS — Z79810 Long term (current) use of selective estrogen receptor modulators (SERMs): Secondary | ICD-10-CM

## 2015-04-21 NOTE — Progress Notes (Signed)
    GYNECOLOGY PROGRESS NOTE  Subjective:    Patient ID: Felicia Ford, female    DOB: Nov 07, 1965, 50 y.o.   MRN: [id number]  HPI  Patient is a 50 y.o. [place] female who presents for f/u after endometrial biopsy and ultrasound for initial episode of heavy and prolonged bleeding on Tamoxifen therapy. Patient notes that her bleeding has resolved since last visit. Last appointment rescheduled due to MD being called away.    The following portions of the patient's history were reviewed and updated as appropriate: allergies, current medications, past family history, past medical history, past social history, past surgical history and problem list.  Review of Systems Pertinent items noted in HPI and remainder of comprehensive ROS otherwise negative.   Objective:   Blood pressure 114/79, pulse 91, weight 283 lb 9 oz (128.623 kg), last menstrual period 03/10/2015. General appearance: alert and no distress No exam performed today.    Pathology (03/27/2015):   Diagnosis:  ENDOMETRIUM, BIOPSY:  UNDERDEVELOPED ENDOMETRIAL GLANDS AND DECIDUALIZED STROMA COMPATIBLE WITH PROGESTIN EFFECT. NO HYPERPLASIA OR CARCINOMA.   Imaging: FINDINGS: Uterus  Measurements: 12.7 x 6.3 x 7.0 cm. Multiple small heterogeneously hypoechoic lesions with some posterior acoustic shadowing, most compatible with fibroids ring measuring up to 1.6 x 1.6 x 1.3 cm in the right-sided the uterine body and lower uterine segment. Small submucosal fibroid in the right-sided the uterine body measuring 1.4 x 1.3 x 1.4 cm.  Endometrium  Thickness: 1.7 cm. No focal abnormality visualized.  Right ovary  Measurements: 5.3 x 3.9 x 3.7 cm. Heterogeneously echogenic lesion with increased through transmission in the right ovary measuring 3.5 x 3.6 x 2.6 cm, which has no internal blood flow, and is most characteristic of a hemorrhagic cyst.  Left ovary  Measurements: 2.2 x 2.7 x 3.0 cm. Normal appearance/no adnexal  mass.  Other findings  No abnormal free fluid.  IMPRESSION: 1. Fibroid uterus, as above. 2. Probable hemorrhagic cyst in the right ovary. Short-interval follow up ultrasound in 6-12 weeks is recommended, preferably during the week following the patient's normal menses.  Assessment:   Prolonged heavy bleeding on Tamoxifen Fibroid uterus Right hemorrhagic ovarian cyst  Plan:   1. Heavy bleeding has resolved with medical management with NSAIDs.  Advised patient that if bleeding persists beyond normal time frame, can use NSAIDs each month.  If bleeding becomes persistantly significantly heavier leading to anemia or interfering with quality of life, may need alternative intervention, including discontinuing Tamoxifen and switching to an alternative therapy, or surgical management with endometrial ablation or hysterectomy (as patient not able to use hormonal management options in light of breast cancer and Tamoxifen use).  Otherwise, can continue Tamoxifen therapy for now.  Patient notes understanding. Endometrial biopsy and ultrasound negative for uterine polyps and hyperplasia.  2. Discussed presence of uterine fibroids.   Patient notes that she was previously unaware.  Fibroids are small, not likely to cause significant symptoms.   3. Right hemorrhagic cyst,  small, asymptomatic.   Will resolve without intervention. Patient to f/u if symptoms develop and can re-image.    To follow up as needed, or if symptoms worsen.     A total of 15 minutes were spent face-to-face with the patient during this encounter and over half of that time dealt with counseling and coordination of care.  Rubie Maid, MD [facility]

## 2015-04-26 ENCOUNTER — Emergency Department
Admission: EM | Admit: 2015-04-26 | Discharge: 2015-04-26 | Disposition: A | Discharge status: Home / Self Care | Payer: Medicaid Other | Attending: Emergency Medicine | Admitting: Emergency Medicine

## 2015-04-26 ENCOUNTER — Encounter: Payer: Self-pay | Admitting: Emergency Medicine

## 2015-04-26 DIAGNOSIS — Z7982 Long term (current) use of aspirin: Secondary | ICD-10-CM | POA: Diagnosis not present

## 2015-04-26 DIAGNOSIS — R55 Syncope and collapse: Secondary | ICD-10-CM | POA: Diagnosis not present

## 2015-04-26 DIAGNOSIS — Z79899 Other long term (current) drug therapy: Secondary | ICD-10-CM | POA: Insufficient documentation

## 2015-04-26 LAB — URINALYSIS COMPLETE WITH MICROSCOPIC (ARMC ONLY)
Bilirubin Urine: NEGATIVE
GLUCOSE, UA: NEGATIVE mg/dL
HGB URINE DIPSTICK: NEGATIVE
Ketones, ur: NEGATIVE mg/dL
LEUKOCYTES UA: NEGATIVE
Nitrite: NEGATIVE
PROTEIN: NEGATIVE mg/dL
SPECIFIC GRAVITY, URINE: 1.025 (ref 1.005–1.030)
pH: 5 (ref 5.0–8.0)

## 2015-04-26 LAB — BASIC METABOLIC PANEL
Anion gap: 6 (ref 5–15)
BUN: 15 mg/dL (ref 6–20)
CHLORIDE: 112 mmol/L — AB (ref 101–111)
CO2: 21 mmol/L — ABNORMAL LOW (ref 22–32)
Calcium: 8.3 mg/dL — ABNORMAL LOW (ref 8.9–10.3)
Creatinine, Ser: 0.83 mg/dL (ref 0.44–1.00)
GFR calc non Af Amer: >60 mL/min (ref >60)
Glucose, Bld: 93 mg/dL (ref 65–99)
POTASSIUM: 4.3 mmol/L (ref 3.5–5.1)
SODIUM: 139 mmol/L (ref 135–145)

## 2015-04-26 LAB — CBC
HEMATOCRIT: 33.9 % — AB (ref 35.0–47.0)
Hemoglobin: 10.6 g/dL — ABNORMAL LOW (ref 12.0–16.0)
MCH: 25.5 pg — ABNORMAL LOW (ref 26.0–34.0)
MCHC: 31.3 g/dL — ABNORMAL LOW (ref 32.0–36.0)
MCV: 81.6 fL (ref 80.0–100.0)
Platelets: 256 10*3/uL (ref 150–440)
RBC: 4.15 MIL/uL (ref 3.80–5.20)
RDW: 14.7 % — ABNORMAL HIGH (ref 11.5–14.5)
WBC: 6.4 10*3/uL (ref 3.6–11.0)

## 2015-04-26 LAB — TROPONIN I

## 2015-04-26 MED ORDER — SODIUM CHLORIDE 0.9 % IV BOLUS (SEPSIS)
1000.0000 mL | INTRAVENOUS | Status: AC
  Administered 2015-04-26: 1000 mL via INTRAVENOUS

## 2015-04-26 NOTE — Discharge Instructions (Signed)
No certain cause was found for your episode of nearly passing out, but I suspect vagal versus dehydration.   Return to the emergency department for any worsening condition including chest pain or trouble breathing, passing out, weakness or numbness, or any altered mental status.   Near-Syncope Near-syncope (commonly known as near fainting) is sudden weakness, dizziness, or feeling like you might pass out. During an episode of near-syncope, you may also develop pale skin, have tunnel vision, or feel sick to your stomach (nauseous). Near-syncope may occur when getting up after sitting or while standing for a long time. It is caused by a sudden decrease in blood flow to the brain. This decrease can result from various causes or triggers, most of which are not serious. However, because near-syncope can sometimes be a sign of something serious, a medical evaluation is required. The specific cause is often not determined. HOME CARE INSTRUCTIONS  Monitor your condition for any changes. The following actions may help to alleviate any discomfort you are experiencing:  Have someone stay with you until you feel stable.  Lie down right away and prop your feet up if you start feeling like you might faint. Breathe deeply and steadily. Wait until all the symptoms have passed. Most of these episodes last only a few minutes. You may feel tired for several hours.   Drink enough fluids to keep your urine clear or pale yellow.   If you are taking blood pressure or heart medicine, get up slowly when seated or lying down. Take several minutes to sit and then stand. This can reduce dizziness.  Follow up with your health care provider as directed. SEEK IMMEDIATE MEDICAL CARE IF:   You have a severe headache.   You have unusual pain in the chest, abdomen, or back.   You are bleeding from the mouth or rectum, or you have black or tarry stool.   You have an irregular or very fast heartbeat.   You have  repeated fainting or have seizure-like jerking during an episode.   You faint when sitting or lying down.   You have confusion.   You have difficulty walking.   You have severe weakness.   You have vision problems.  MAKE SURE YOU:   Understand these instructions.  Will watch your condition.  Will get help right away if you are not doing well or get worse.   This information is not intended to replace advice given to you by your health care provider. Make sure you discuss any questions you have with your health care provider.   Document Released: 03/14/2005 Document Revised: 03/19/2013 Document Reviewed: 08/17/2012 Elsevier Interactive Patient Education [employer].

## 2015-04-26 NOTE — ED Notes (Signed)
Pt to ED via EMS from work, [employer], c/o near syncope.  Pt states was sitting in chair around 1700 when felt sudden onset weakness and "my vision started going black".  Pt denies LOC, n/v/d, or pain.  Pt states apartment she was in was hot and walked outside and drank water which helped eased symptoms.  Pt is A&Ox4, speaking in complete and coherent sentences and in NAD at this time.

## 2015-04-26 NOTE — ED Provider Notes (Signed)
[facility] Emergency Department Provider Note   ____________________________________________  Time seen:  I have reviewed the triage vital signs and the triage nursing note.  HISTORY  Chief Complaint Near Syncope   Historian Patient  HPI Felicia Ford is a 50 y.o. female who had a near syncopal episode today. She works in Teacher, adult education and was feeling overheated and lightheaded and started to walk towards the door to sit down. She nearly passed out but did not pass out fully. There is no palpitations or shortness of breath or chest pain. There was some sensation of upper chest fluttering.  She hadn't eaten since noon it had been about 5-6 hours. She states she had been drinking throughout the day. She denies headache, confusion, weakness, numbness. Symptoms are moderate. She feels better now. She might be dehydrated.    Past Medical History  Diagnosis Date  . Sleep apnea   . Anemia   . Elevated blood pressure     PT GOING TO [facility] 09[facility]11[facility]14 FOR RECHECK PF BP TO DETERMINE IT BP MEDS NEED TO BE STARTED  . Breast cancer ([place]) 08/26/14    Left breast DCIS, ER positive, PR positive.    Patient Active Problem List   Diagnosis Date Noted  . Menorrhagia with regular cycle 03/31/2015  . Use of tamoxifen (Nolvadex) 03/31/2015  . Obesity, morbid, BMI 50 or higher ([place]) 03/31/2015  . Perimenopausal vasomotor symptoms 01/21/2015  . 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 Breast neoplasm, Tis (DCIS) 09/02/2014    Past Surgical History  Procedure Laterality Date  . Breast biopsy Left 08/26/2014    DCIS  . Breast biopsy Left 09/18/2014    Procedure: BREAST BIOPSY WITH NEEDLE LOCALIZATION;  Surgeon: Robert Bellow, MD;  Location: [facility] ORS;  Service: General;  Laterality: Left;  . Breast mammosite Left 10/07/2014    Procedure: MAMMOSITE BREAST;  Surgeon: Robert Bellow, MD;  Location: [facility] ORS;  Service: General;  Laterality: Left;  . Breast  lumpectomy Left 08/2014    DCIS    Current Outpatient Rx  Name  Route  Sig  Dispense  Refill  . aspirin 81 MG tablet   Oral   Take 81 mg by mouth daily.         . tamoxifen (NOLVADEX) 20 MG tablet            10     Allergies Review of patient's allergies indicates no known allergies.  Family History  Problem Relation Age of Onset  . Diabetes Mother   . Lupus Sister   . Lung cancer Maternal Uncle   . Breast cancer Neg Hx     Social History Social History  Substance Use Topics  . Smoking status: Never Smoker   . Smokeless tobacco: Never Used  . Alcohol Use: No    Review of Systems  Constitutional: Negative for fever. Eyes: Negative for visual changes. ENT: Negative for sore throat. Cardiovascular: Negative for chest pain. Respiratory: Negative for shortness of breath. Gastrointestinal: Negative for abdominal pain, vomiting and diarrhea. Genitourinary: Negative for dysuria. Musculoskeletal: Negative for back pain. Skin: Negative for rash. Neurological: Negative for headache. 10 point Review of Systems otherwise negative ____________________________________________   PHYSICAL EXAM:  VITAL SIGNS: ED Triage Vitals  Enc Vitals Group     BP 04/26/15 1757 136/73 mmHg     Pulse Rate 04/26/15 1757 69     Resp 04/26/15 1757 16     Temp 04/26/15 1757 98 F (  36.7 C)     Temp Source 04/26/15 1757 Oral     SpO2 04/26/15 1757 100 %     Weight 04/26/15 1757 275 lb 9.2 oz (125 kg)     Height 04/26/15 1757 5\' 2"  (1.575 m)     Head Cir --      Peak Flow --      Pain Score --      Pain Loc --      Pain Edu? --      Excl. in [place]? --      Constitutional: Alert and oriented. Well appearing and in no distress. Eyes: Conjunctivae are normal. PERRL. Normal extraocular movements. ENT   Head: Normocephalic and atraumatic.   Nose: No congestion/rhinnorhea.   Mouth/Throat: Mucous membranes are moist.   Neck: No stridor. Cardiovascular/Chest: Normal rate,  regular rhythm.  No murmurs, rubs, or gallops. Respiratory: Normal respiratory effort without tachypnea nor retractions. Breath sounds are clear and equal bilaterally. No wheezes/rales/rhonchi. Gastrointestinal: Soft. No distention, no guarding, no rebound. Nontender.   Genitourinary/rectal:Deferred Musculoskeletal: Nontender with normal range of motion in all extremities. No joint effusions.  No lower extremity tenderness.  No edema. Neurologic:  Normal speech and language. No gross or focal neurologic deficits are appreciated. Skin:  Skin is warm, dry and intact. No rash noted. Psychiatric: Mood and affect are normal. Speech and behavior are normal. Patient exhibits appropriate insight and judgment.  ____________________________________________   EKG I, Lisa Roca, MD, the attending physician have personally viewed and interpreted all ECGs.  73 bpm. Normal sinus rhythm. Narrow QRS. Normal axis. Normal ST and T-wave ____________________________________________  LABS (pertinent positives/negatives)  Urinalysis negative Basic metabolic panel without significant abnormality White blood count 6.4, hemoglobin 10.6 and platelet count 256 Troponin less than 0.03  ____________________________________________  RADIOLOGY All Xrays were viewed by me. Imaging interpreted by Radiologist.  None __________________________________________  PROCEDURES  Procedure(s) performed: None  Critical Care performed: None  ____________________________________________   ED COURSE / ASSESSMENT AND PLAN  Pertinent labs & imaging results that were available during my care of the patient were reviewed by me and considered in my medical decision making (see chart for details).    This patient had a near syncopal episode which I suspect may be related to dehydration versus a vagal episode. Symptoms do not sound neurologic such as stroke or TIA and I don't recommend head imaging today.  No actual  chest pain or trouble breathing and I am not suspicious of acute syndrome. Her EKG and initial troponin are reassuring.  Patient feels better now after some hydration and will be discharged home. She states she does not have a primary care physician and she will she will be referred to [facility] clinic. I have also asked her to follow up with cardiology.    CONSULTATIONS:   None   Patient / Family / Caregiver informed of clinical course, medical decision-making process, and agree with plan.   I discussed return precautions, follow-up instructions, and discharged instructions with patient and/or family.   ___________________________________________   FINAL CLINICAL IMPRESSION(S) / ED DIAGNOSES   Final diagnoses:  Near syncope              Note: This dictation was prepared with Dragon dictation. Any transcriptional errors that result from this process are unintentional   Lisa Roca, MD 04/26/15 2132

## 2015-05-04 ENCOUNTER — Ambulatory Visit: Payer: Medicaid Other | Attending: Radiation Oncology | Admitting: Radiation Oncology

## 2015-07-06 ENCOUNTER — Encounter: Payer: Self-pay | Admitting: *Deleted

## 2015-07-06 NOTE — Progress Notes (Signed)
Patient's BCCCP Medicaid renewal faxed to Royetta Asal at [facility].

## 2015-07-22 ENCOUNTER — Other Ambulatory Visit: Payer: Self-pay

## 2015-07-22 DIAGNOSIS — D0512 Intraductal carcinoma in situ of left breast: Secondary | ICD-10-CM

## 2015-09-28 ENCOUNTER — Other Ambulatory Visit: Payer: Self-pay | Admitting: General Surgery

## 2015-09-28 ENCOUNTER — Ambulatory Visit
Admission: RE | Admit: 2015-09-28 | Discharge: 2015-09-28 | Disposition: A | Discharge status: Home / Self Care | Payer: Medicaid Other | Source: Ambulatory Visit | Attending: General Surgery | Admitting: General Surgery

## 2015-09-28 DIAGNOSIS — D0512 Intraductal carcinoma in situ of left breast: Secondary | ICD-10-CM

## 2015-09-28 DIAGNOSIS — Z853 Personal history of malignant neoplasm of breast: Secondary | ICD-10-CM | POA: Diagnosis present

## 2015-09-30 ENCOUNTER — Encounter: Payer: Self-pay | Admitting: *Deleted

## 2015-10-06 ENCOUNTER — Ambulatory Visit: Payer: Medicaid Other | Admitting: General Surgery

## 2015-11-12 ENCOUNTER — Telehealth: Payer: Self-pay | Admitting: *Deleted

## 2015-11-12 NOTE — Telephone Encounter (Signed)
Patient called to report since taking Tamoxifen she has been experiencing bad side effects. She states she's been having hot flashes, itching and a vaginal burning. She stopped taking it once but started it back and wants to know if there is anything else she can try for the burning ?

## 2015-11-14 NOTE — Telephone Encounter (Signed)
The patient reports she is still having regular menses, so switching to an AI is not an option. The hot flashes are not the most troubling symptom, rather the vaginal itching and burning. She reports she has not had intercourse in the past year, so she knows it is "not something else". Options reviewed: 1) D/C tamoxifen (intermediate grade DCIS s/p complete excision and RT) vs 2) Addition of vaginal estrogen. Will have the staff send in an RX for Premarin cream, to use 2 x/ week until symptoms resolve, then decrease to [employer] applications. Patient asked to call with progress report in one month.

## 2015-11-17 ENCOUNTER — Telehealth: Payer: Self-pay | Admitting: *Deleted

## 2015-11-17 MED ORDER — ESTROGENS, CONJUGATED 0.625 MG/GM VA CREA
0.5000 | TOPICAL_CREAM | VAGINAL | 2 refills | Status: DC
Start: 2015-11-19 — End: 2016-09-21

## 2015-11-17 NOTE — Telephone Encounter (Signed)
-----   Message from Robert Bellow, MD sent at 11/14/2015 11:37 AM EDT ----- Please send an RX for Premarin cream to her pharmacy, 1/2 applicator 2/x week until symptoms resolve, then once/ week. 2 RF. Thanks.

## 2015-11-17 NOTE — Telephone Encounter (Signed)
Notified pt of RX, pt agrees.

## 2015-11-24 ENCOUNTER — Encounter: Payer: Self-pay | Admitting: *Deleted

## 2015-12-01 ENCOUNTER — Ambulatory Visit: Payer: Medicaid Other | Admitting: General Surgery

## 2016-05-25 ENCOUNTER — Emergency Department
Admission: EM | Admit: 2016-05-25 | Discharge: 2016-05-25 | Disposition: A | Discharge status: Home / Self Care | Payer: Medicaid Other | Attending: Emergency Medicine | Admitting: Emergency Medicine

## 2016-05-25 DIAGNOSIS — Z7982 Long term (current) use of aspirin: Secondary | ICD-10-CM | POA: Diagnosis not present

## 2016-05-25 DIAGNOSIS — M436 Torticollis: Secondary | ICD-10-CM | POA: Insufficient documentation

## 2016-05-25 DIAGNOSIS — Z853 Personal history of malignant neoplasm of breast: Secondary | ICD-10-CM | POA: Insufficient documentation

## 2016-05-25 DIAGNOSIS — M542 Cervicalgia: Secondary | ICD-10-CM | POA: Diagnosis present

## 2016-05-25 MED ORDER — CYCLOBENZAPRINE HCL 10 MG PO TABS: 10.0000 mg | ORAL_TABLET | Freq: Three times a day (TID) | ORAL | 0 refills | Status: DC | PRN

## 2016-05-25 MED ORDER — NAPROXEN 500 MG PO TABS: 500.0000 mg | ORAL_TABLET | Freq: Two times a day (BID) | ORAL | 0 refills | Status: DC

## 2016-05-25 NOTE — ED Notes (Signed)
See triage note  States she developed pain to left side of neck about 1 week ago  States pain radiates into left shoulder  Min relief with ibu  Denies any injury

## 2016-05-25 NOTE — ED Triage Notes (Signed)
She is ambulatory to triage with reports of left sided neck and arm pain for greater than one week  Reports increase in pain with movement

## 2016-05-25 NOTE — Discharge Instructions (Signed)
Follow up with your primary care provider for symptoms that are not improving over the next few days.  Return to the ER for symptoms that change or worsen if unable to schedule an appointment. 

## 2016-05-25 NOTE — ED Provider Notes (Signed)
[facility] Emergency Department Provider Note ____________________________________________  Time seen: Approximately 12:10 PM  I have reviewed the triage vital signs and the nursing notes.   HISTORY  Chief Complaint Neck Pain and Arm Pain    HPI Felicia Ford is a 51 y.o. female who presents to the emergency department for left shoulder and neck pain. Pain started without known injury. 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 She has been taking ibuprofen over-the-counter without relief of pain. Past Medical History:  Diagnosis Date  . Anemia   . Breast cancer ([place]) 08/26/14   Left breast DCIS, ER positive, PR positive. With mammosite rad tx  . Elevated blood pressure    PT GOING TO [facility]11[facility]14 FOR RECHECK PF BP TO DETERMINE IT BP MEDS NEED TO BE STARTED  . Sleep apnea     Patient Active Problem List   Diagnosis Date Noted  . Menorrhagia with regular cycle 03/31/2015  . Use of tamoxifen (Nolvadex) 03/31/2015  . Obesity, morbid, BMI 50 or higher ([place]) 03/31/2015  . Perimenopausal vasomotor symptoms 01/21/2015  . 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 Breast neoplasm, Tis (DCIS) 09/02/2014    Past Surgical History:  Procedure Laterality Date  . BREAST BIOPSY Left 08/26/2014   DCIS  . BREAST BIOPSY Left 09/18/2014   Procedure: BREAST BIOPSY WITH NEEDLE LOCALIZATION;  Surgeon: Robert Bellow, MD;  Location: [facility] ORS;  Service: General;  Laterality: Left;  . BREAST LUMPECTOMY Left 08/2014   DCIS  . BREAST MAMMOSITE Left 10/07/2014   Procedure: MAMMOSITE BREAST;  Surgeon: Robert Bellow, MD;  Location: [facility] ORS;  Service: General;  Laterality: Left;    Prior to Admission medications   Medication Sig Start Date End Date Taking? Authorizing Provider  aspirin 81 MG tablet Take 81 mg by mouth daily.    Historical Provider, MD  conjugated estrogens (PREMARIN) vaginal cream Place 0.5  Applicatorfuls vaginally 2 (two) times a week. Until symptoms resolve then use once a week 11/19/15   Robert Bellow, MD  cyclobenzaprine (FLEXERIL) 10 MG tablet Take 1 tablet (10 mg total) by mouth 3 (three) times daily as needed for muscle spasms. 05/25/16   Victorino Dike, FNP  naproxen (NAPROSYN) 500 MG tablet Take 1 tablet (500 mg total) by mouth 2 (two) times daily with a meal. 05/25/16   Victorino Dike, FNP  tamoxifen (NOLVADEX) 20 MG tablet  01/22/15   Historical Provider, MD    Allergies Patient has no known allergies.  Family History  Problem Relation Age of Onset  . Diabetes Mother   . Lupus Sister   . Lung cancer Maternal Uncle   . Breast cancer Neg Hx     Social History Social History  Substance Use Topics  . Smoking status: Never Smoker  . Smokeless tobacco: Never Used  . Alcohol use No    Review of Systems Constitutional: No recent illness. Cardiovascular: Denies chest pain or palpitations. Respiratory: Denies shortness of breath. Musculoskeletal: Pain in Left lateral neck that radiates into the left shoulder Skin: Negative for rash, wound, lesion. Neurological: Negative for focal weakness or numbness.  ____________________________________________   PHYSICAL EXAM:  VITAL SIGNS: ED Triage Vitals  Enc Vitals Group     BP 05/25/16 1029 (!) 158/86     Pulse Rate 05/25/16 1029 83     Resp 05/25/16 1029 18     Temp 05/25/16 1029  97.7 F (36.5 C)     Temp Source 05/25/16 1029 Oral     SpO2 05/25/16 1029 100 %     Weight 05/25/16 1030 247 lb (112 kg)     Height 05/25/16 1030 5\' 2"  (1.575 m)     Head Circumference --      Peak Flow --      Pain Score 05/25/16 1034 10     Pain Loc --      Pain Edu? --      Excl. in [place]? --     Constitutional: Alert and oriented. Well appearing and in no acute distress. Eyes: Conjunctivae are normal. EOMI. Head: Atraumatic. Neck: No stridor.  Respiratory: Normal respiratory effort.   Musculoskeletal: Tender to  palpation over the left sternocleidomastoid muscle with radiation into the left superior shoulder. Patient has restricted movement secondary to pain however is able to internally and externally rotate the shoulder. She is able to perform arm abduction to approximately 50 before initiation of pain in the left side of her neck. Neurologic:  Normal speech and language. No gross focal neurologic deficits are appreciated. Speech is normal. No gait instability. Skin:  Skin is warm, dry and intact. Atraumatic. Psychiatric: Mood and affect are normal. Speech and behavior are normal.  ____________________________________________   LABS (all labs ordered are listed, but only abnormal results are displayed)  Labs Reviewed - No data to display ____________________________________________  RADIOLOGY  Not indicated ____________________________________________   PROCEDURES  Procedure(s) performed: None  ____________________________________________   INITIAL IMPRESSION / ASSESSMENT AND PLAN / ED COURSE  51 year old female presenting to the emergency department for evaluation of left-sided neck and shoulder pain. Symptoms and exam consistent with acute torticollis without known trigger. She'll be treated with Flexeril and Naprosyn. She was advised to follow-up with her primary care provider for symptoms that are not improving over the next 2 days. She was advised to return to the emergency department for symptoms that change or worsen if she's unable schedule an appointment.  Pertinent labs & imaging results that were available during my care of the patient were reviewed by me and considered in my medical decision making (see chart for details).  _________________________________________   FINAL CLINICAL IMPRESSION(S) / ED DIAGNOSES  Final diagnoses:  Torticollis    Discharge Medication List as of 05/25/2016 12:24 PM    START taking these medications   Details  cyclobenzaprine (FLEXERIL)  10 MG tablet Take 1 tablet (10 mg total) by mouth 3 (three) times daily as needed for muscle spasms., Starting Wed 05/25/2016, Print    naproxen (NAPROSYN) 500 MG tablet Take 1 tablet (500 mg total) by mouth 2 (two) times daily with a meal., Starting Wed 05/25/2016, Print        If controlled substance prescribed during this visit, 12 month history viewed on the [place] prior to issuing an initial prescription for Schedule II or III opiod.    Victorino Dike, FNP 05/25/16 1300    Earleen Newport, MD 05/25/16 1415

## 2016-07-07 ENCOUNTER — Encounter: Payer: Self-pay | Admitting: *Deleted

## 2016-07-12 ENCOUNTER — Ambulatory Visit: Payer: Medicaid Other | Admitting: General Surgery

## 2016-07-19 ENCOUNTER — Telehealth: Payer: Self-pay | Admitting: *Deleted

## 2016-07-19 NOTE — Telephone Encounter (Signed)
Called patient yesterday to discuss her [employer] Medicaid.  Explained that she had not been seen by Dr. Bary Castilla in over a year. Explained that we could not renew her Medicaid unless she is following up with him, since he is the one who prescribes her Tamoxifen.  States she is still taking the Tamoxifen.  She had missed numerous appointments with him over the past year.  Explained that unless she made an appointment and followed up with him I would not renew her Medicaid.  As of this morning the patient does not have an appointment.  Faxed DSS to discontinue the patients Medicaid.  If she does see Dr. Bary Castilla and continues to take Tamoxifen I will be glad to reinstate her Medicaid.

## 2016-08-09 ENCOUNTER — Ambulatory Visit (INDEPENDENT_AMBULATORY_CARE_PROVIDER_SITE_OTHER): Payer: Medicaid Other | Admitting: General Surgery

## 2016-08-09 ENCOUNTER — Encounter: Payer: Self-pay | Admitting: General Surgery

## 2016-08-09 VITALS — BP 134/72 | HR 76 | Resp 12 | Ht 64.0 in | Wt 287.0 lb

## 2016-08-09 DIAGNOSIS — Z1211 Encounter for screening for malignant neoplasm of colon: Secondary | ICD-10-CM

## 2016-08-09 DIAGNOSIS — D0512 Intraductal carcinoma in situ of left breast: Secondary | ICD-10-CM

## 2016-08-09 MED ORDER — POLYETHYLENE GLYCOL 3350 17 GM/SCOOP PO POWD: 1.0000 | Freq: Once | ORAL | 0 refills | Status: AC

## 2016-08-09 NOTE — Progress Notes (Signed)
Patient ID: Felicia Ford, female   DOB: 09-May-1965, 51 y.o.   MRN: [id number]  Chief Complaint  Patient presents with  . Follow-up    HPI Felicia Ford is a 51 y.o. female who presents for a breast evaluation. The most recent mammogram was done on 09/28/2015.  This is a delayed follow up exam.  Patient does perform regular self breast checks and gets regular mammograms done.  Doing well on the Tamoxifen. Candidate for initial screening colonoscopy based on age and past history of breast cancer. HPI  Past Medical History:  Diagnosis Date  . Anemia   . Breast cancer ([place]) 08/26/2014   Left breast DCIS, ER positive, PR positive. With mammosite rad tx  . Elevated blood pressure    PT GOING [facility]14 FOR RECHECK PF BP TO DETERMINE IT BP MEDS NEED TO BE STARTED  . Sleep apnea     Past Surgical History:  Procedure Laterality Date  . BREAST BIOPSY Left 08/26/2014   DCIS  . BREAST BIOPSY Left 09/18/2014   Procedure: BREAST BIOPSY WITH NEEDLE LOCALIZATION;  Surgeon: Robert Bellow, MD;  Location: [facility] ORS;  Service: General;  Laterality: Left;  . BREAST LUMPECTOMY Left 08/2014   DCIS  . BREAST MAMMOSITE Left 10/07/2014   Procedure: MAMMOSITE BREAST;  Surgeon: Robert Bellow, MD;  Location: [facility] ORS;  Service: General;  Laterality: Left;    Family History  Problem Relation Age of Onset  . Diabetes Mother   . Lupus Sister   . Lung cancer Maternal Uncle   . Breast cancer Neg Hx     Social History Social History  Substance Use Topics  . Smoking status: Never Smoker  . Smokeless tobacco: Never Used  . Alcohol use No    No Known Allergies  Current Outpatient Prescriptions  Medication Sig Dispense Refill  . aspirin 81 MG tablet Take 81 mg by mouth daily.    Marland Kitchen conjugated estrogens (PREMARIN) vaginal cream Place 0.5 Applicatorfuls vaginally 2 (two) times a week. Until symptoms resolve then use once a week (Patient taking differently: Place 0.5 Applicatorfuls  vaginally as needed. Until symptoms resolve then use once a week) 42.5 g 2  . cyclobenzaprine (FLEXERIL) 10 MG tablet Take 1 tablet (10 mg total) by mouth 3 (three) times daily as needed for muscle spasms. 30 tablet 0  . naproxen (NAPROSYN) 500 MG tablet Take 1 tablet (500 mg total) by mouth 2 (two) times daily with a meal. 30 tablet 0  . tamoxifen (NOLVADEX) 20 MG tablet   10   No current facility-administered medications for this visit.     Review of Systems Review of Systems  Constitutional: Negative.   Respiratory: Negative.   Cardiovascular: Negative.     Blood pressure 134/72, pulse 76, resp. rate 12, height 5\' 4"  (1.626 m), weight 287 lb (130.2 kg).  Physical Exam Physical Exam  Constitutional: She is oriented to person, place, and time. She appears well-developed and well-nourished.  Eyes: Conjunctivae are normal. No scleral icterus.  Neck: Neck supple.  Cardiovascular: Normal rate, regular rhythm and normal heart sounds.   Pulmonary/Chest: Effort normal and breath sounds normal. Right breast exhibits no inverted nipple, no mass, no nipple discharge, no skin change and no tenderness. Left breast exhibits no inverted nipple, no mass, no nipple discharge, no skin change and no tenderness.    Left breast thickening at scar.   Abdominal: Soft. Bowel sounds are normal. There is no tenderness.  Lymphadenopathy:  She has no cervical adenopathy.    She has no axillary adenopathy.  Neurological: She is alert and oriented to person, place, and time.  Skin: Skin is warm and dry.    Data Reviewed September 28, 2015 mammograms reviewed. No interval change. BIRAD-2.    Assessment    Benign breast exam.     Plan        Patient to have a bilateral diagnotic mammogram in July, 2018. Does not have to come in after this mammogram. Asked to call when study completed so films can be reviewed.   Put in recall for mammogram in July ,2019.   Colonoscopy with possible  biopsy/polypectomy prn: Information regarding the procedure, including its potential risks and complications (including but not limited to perforation of the bowel, which may require emergency surgery to repair, and bleeding) was verbally given to the patient. Educational information regarding lower intestinal endoscopy was given to the patient. Written instructions for how to complete the bowel prep using Miralax were provided. The importance of drinking ample fluids to avoid dehydration as a result of the prep emphasized.  HPI, Physical Exam, Assessment and Plan have been scribed under the direction and in the presence of Felicia Ard, MD.  Felicia Ford, CMA  I have completed the exam and reviewed the above documentation for accuracy and completeness.  I agree with the above.  Haematologist has been used and any errors in dictation or transcription are unintentional.  Felicia Ford, M.D., F.A.C.S.  The patient is scheduled for a Colonoscopy at [facility] on 09/21/16. They are aware to call the day before to get their arrival time. Miralax prescription has been sent into the patient's pharmacy. The patient is aware of date and instructions.  Documented by Lesly Rubenstein LPN   Robert Bellow 08/10/2016, 8:23 PM

## 2016-08-09 NOTE — Patient Instructions (Addendum)
Patient to have a bilateral diagnotic mammogram in July, 2018. Does not have to come in after this mammogram.  Colonoscopy, Adult A colonoscopy is an exam to look at the entire large intestine. During the exam, a lubricated, bendable tube is inserted into the anus and then passed into the rectum, colon, and other parts of the large intestine. A colonoscopy is often done as a part of normal colorectal screening or in response to certain symptoms, such as anemia, persistent diarrhea, abdominal pain, and blood in the stool. The exam can help screen for and diagnose medical problems, including:  Tumors.  Polyps.  Inflammation.  Areas of bleeding. Tell a health care provider about:  Any allergies you have.  All medicines you are taking, including vitamins, herbs, eye drops, creams, and over-the-counter medicines.  Any problems you or family members have had with anesthetic medicines.  Any blood disorders you have.  Any surgeries you have had.  Any medical conditions you have.  Any problems you have had passing stool. What are the risks? Generally, this is a safe procedure. However, problems may occur, including:  Bleeding.  A tear in the intestine.  A reaction to medicines given during the exam.  Infection (rare). What happens before the procedure? Eating and drinking restrictions  Follow instructions from your health care provider about eating and drinking, which may include:  A few days before the procedure - follow a low-fiber diet. Avoid nuts, seeds, dried fruit, raw fruits, and vegetables.  1-3 days before the procedure - follow a clear liquid diet. Drink only clear liquids, such as clear broth or bouillon, black coffee or tea, clear juice, clear soft drinks or sports drinks, gelatin dessert, and popsicles. Avoid any liquids that contain red or purple dye.  On the day of the procedure - do not eat or drink anything during the 2 hours before the procedure, or within the  time period that your health care provider recommends. Bowel prep  If you were prescribed an oral bowel prep to clean out your colon:  Take it as told by your health care provider. Starting the day before your procedure, you will need to drink a large amount of medicated liquid. The liquid will cause you to have multiple loose stools until your stool is almost clear or light green.  If your skin or anus gets irritated from diarrhea, you may use these to relieve the irritation:  Medicated wipes, such as adult wet wipes with aloe and vitamin E.  A skin soothing-product like petroleum jelly.  If you vomit while drinking the bowel prep, take a break for up to 60 minutes and then begin the bowel prep again. If vomiting continues and you cannot take the bowel prep without vomiting, call your health care provider. General instructions   Ask your health care provider about changing or stopping your regular medicines. This is especially important if you are taking diabetes medicines or blood thinners.  Plan to have someone take you home from the hospital or clinic. What happens during the procedure?  An IV tube may be inserted into one of your veins.  You will be given medicine to help you relax (sedative).  To reduce your risk of infection:  Your health care team will wash or sanitize their hands.  Your anal area will be washed with soap.  You will be asked to lie on your side with your knees bent.  Your health care provider will lubricate a long, thin, flexible tube. The  tube will have a camera and a light on the end.  The tube will be inserted into your anus.  The tube will be gently eased through your rectum and colon.  Air will be delivered into your colon to keep it open. You may feel some pressure or cramping.  The camera will be used to take images during the procedure.  A small tissue sample may be removed from your body to be examined under a microscope (biopsy). If any  potential problems are found, the tissue will be sent to a lab for testing.  If small polyps are found, your health care provider may remove them and have them checked for cancer cells.  The tube that was inserted into your anus will be slowly removed. The procedure may vary among health care providers and hospitals. What happens after the procedure?  Your blood pressure, heart rate, breathing rate, and blood oxygen level will be monitored until the medicines you were given have worn off.  Do not drive for 24 hours after the exam.  You may have a small amount of blood in your stool.  You may pass gas and have mild abdominal cramping or bloating due to the air that was used to inflate your colon during the exam.  It is up to you to get the results of your procedure. Ask your health care provider, or the department performing the procedure, when your results will be ready. This information is not intended to replace advice given to you by your health care provider. Make sure you discuss any questions you have with your health care provider. Document Released: 03/11/2000 Document Revised: 01/13/2016 Document Reviewed: 05/26/2015 Elsevier Interactive Patient Education  2017 [employer].  The patient is scheduled for a Colonoscopy at [facility] on 09/21/16. They are aware to call the day before to get their arrival time. Miralax prescription has been sent into the patient's pharmacy. The patient is aware of date and instructions.

## 2016-08-10 ENCOUNTER — Encounter: Payer: Self-pay | Admitting: General Surgery

## 2016-08-10 DIAGNOSIS — Z1211 Encounter for screening for malignant neoplasm of colon: Secondary | ICD-10-CM | POA: Insufficient documentation

## 2016-08-27 ENCOUNTER — Other Ambulatory Visit: Payer: Self-pay | Admitting: General Surgery

## 2016-08-27 DIAGNOSIS — D0512 Intraductal carcinoma in situ of left breast: Secondary | ICD-10-CM

## 2016-09-13 ENCOUNTER — Other Ambulatory Visit: Payer: Self-pay | Admitting: General Surgery

## 2016-09-13 DIAGNOSIS — Z1211 Encounter for screening for malignant neoplasm of colon: Secondary | ICD-10-CM

## 2016-09-15 ENCOUNTER — Telehealth: Payer: Self-pay | Admitting: *Deleted

## 2016-09-15 NOTE — Telephone Encounter (Signed)
Patient was contacted today and confirms no medication changes since her last office visit.   This patient reports that she has picked up Miralax prescription.  We will proceed with colonoscopy as scheduled for 09-21-16 at [facility].   Patient was encouraged to call the office should she have further questions.

## 2016-09-21 ENCOUNTER — Ambulatory Visit: Payer: Medicaid Other | Admitting: Anesthesiology

## 2016-09-21 ENCOUNTER — Encounter
Admission: RE | Disposition: A | Discharge status: Home / Self Care | Payer: Self-pay | Source: Ambulatory Visit | Attending: General Surgery

## 2016-09-21 ENCOUNTER — Encounter: Payer: Self-pay | Admitting: *Deleted

## 2016-09-21 ENCOUNTER — Ambulatory Visit
Admission: RE | Admit: 2016-09-21 | Discharge: 2016-09-21 | Disposition: A | Discharge status: Home / Self Care | Payer: Medicaid Other | Source: Ambulatory Visit | Attending: General Surgery | Admitting: General Surgery

## 2016-09-21 DIAGNOSIS — Z1211 Encounter for screening for malignant neoplasm of colon: Secondary | ICD-10-CM

## 2016-09-21 DIAGNOSIS — Z7982 Long term (current) use of aspirin: Secondary | ICD-10-CM | POA: Insufficient documentation

## 2016-09-21 DIAGNOSIS — Z791 Long term (current) use of non-steroidal anti-inflammatories (NSAID): Secondary | ICD-10-CM | POA: Insufficient documentation

## 2016-09-21 DIAGNOSIS — Z79899 Other long term (current) drug therapy: Secondary | ICD-10-CM | POA: Insufficient documentation

## 2016-09-21 DIAGNOSIS — G4730 Sleep apnea, unspecified: Secondary | ICD-10-CM | POA: Insufficient documentation

## 2016-09-21 DIAGNOSIS — Z79810 Long term (current) use of selective estrogen receptor modulators (SERMs): Secondary | ICD-10-CM | POA: Insufficient documentation

## 2016-09-21 DIAGNOSIS — Z87898 Personal history of other specified conditions: Secondary | ICD-10-CM | POA: Insufficient documentation

## 2016-09-21 HISTORY — PX: COLONOSCOPY WITH PROPOFOL: SHX5780

## 2016-09-21 LAB — POCT PREGNANCY, URINE: PREG TEST UR: NEGATIVE

## 2016-09-21 SURGERY — COLONOSCOPY WITH PROPOFOL
Anesthesia: General

## 2016-09-21 MED ORDER — PROPOFOL 500 MG/50ML IV EMUL
INTRAVENOUS | Status: AC
  Filled 2016-09-21: qty 50

## 2016-09-21 MED ORDER — PROPOFOL 500 MG/50ML IV EMUL
INTRAVENOUS | Status: DC | PRN
  Administered 2016-09-21: 125 ug/kg/min via INTRAVENOUS

## 2016-09-21 MED ORDER — PROPOFOL 10 MG/ML IV BOLUS
INTRAVENOUS | Status: DC | PRN
  Administered 2016-09-21: 50 mg via INTRAVENOUS

## 2016-09-21 MED ORDER — SODIUM CHLORIDE 0.9 % IV SOLN
INTRAVENOUS | Status: DC
  Administered 2016-09-21: 08:00:00 via INTRAVENOUS
  Administered 2016-09-21: 1000 mL via INTRAVENOUS

## 2016-09-21 MED ORDER — DEXMEDETOMIDINE HCL 200 MCG/2ML IV SOLN
INTRAVENOUS | Status: DC | PRN
  Administered 2016-09-21: 8 ug via INTRAVENOUS

## 2016-09-21 MED ORDER — GLYCOPYRROLATE 0.2 MG/ML IJ SOLN
INTRAMUSCULAR | Status: DC | PRN
  Administered 2016-09-21: 0.2 mg via INTRAVENOUS

## 2016-09-21 NOTE — Anesthesia Post-op Follow-up Note (Cosign Needed)
Anesthesia QCDR form completed.        

## 2016-09-21 NOTE — Anesthesia Postprocedure Evaluation (Signed)
Anesthesia Post Note  Patient: Felicia Ford  Procedure(s) Performed: Procedure(s) (LRB): COLONOSCOPY WITH PROPOFOL (N/A)  Patient location during evaluation: Endoscopy Anesthesia Type: General Level of consciousness: awake and alert and oriented Pain management: pain level controlled Vital Signs Assessment: post-procedure vital signs reviewed and stable Respiratory status: spontaneous breathing, nonlabored ventilation and respiratory function stable Cardiovascular status: blood pressure returned to baseline and stable Postop Assessment: no signs of nausea or vomiting Anesthetic complications: no     Last Vitals:  Vitals:   09/21/16 0918 09/21/16 0928  BP: 129/83 136/85  Pulse: 77 72  Resp: (!) 23   Temp:      Last Pain:  Vitals:   09/21/16 0858  TempSrc: Tympanic                 Sharena Dibenedetto

## 2016-09-21 NOTE — H&P (Signed)
KEELI ROBERG [id number] 09-02-1965     HPI:  51 y/o woman for screening colonoscopy. Tolerated prep well, but did have small volume emesis at the end.  Reports stools are watery.  Prescriptions Prior to Admission  Medication Sig Dispense Refill Last Dose  . aspirin 81 MG tablet Take 81 mg by mouth daily.   Taking  . cyclobenzaprine (FLEXERIL) 10 MG tablet Take 1 tablet (10 mg total) by mouth 3 (three) times daily as needed for muscle spasms. 30 tablet 0 Taking  . naproxen (NAPROSYN) 500 MG tablet Take 1 tablet (500 mg total) by mouth 2 (two) times daily with a meal. 30 tablet 0 Taking  . tamoxifen (NOLVADEX) 20 MG tablet TAKE 1 TABLET(20 MG) BY MOUTH DAILY 30 tablet 0   . [DISCONTINUED] conjugated estrogens (PREMARIN) vaginal cream Place 0.5 Applicatorfuls vaginally 2 (two) times a week. Until symptoms resolve then use once a week (Patient taking differently: Place 0.5 Applicatorfuls vaginally as needed. Until symptoms resolve then use once a week) 42.5 g 2 Taking  . [DISCONTINUED] tamoxifen (NOLVADEX) 20 MG tablet   10 Taking   Allergies  Allergen Reactions  . Latex    Past Medical History:  Diagnosis Date  . Anemia   . Breast cancer ([place]) 08/26/2014   Left breast DCIS, ER positive, PR positive. With mammosite rad tx  . Elevated blood pressure    PT GOING [facility]14 FOR RECHECK PF BP TO DETERMINE IT BP MEDS NEED TO BE STARTED  . Sleep apnea    Past Surgical History:  Procedure Laterality Date  . BREAST BIOPSY Left 08/26/2014   DCIS  . BREAST BIOPSY Left 09/18/2014   Procedure: BREAST BIOPSY WITH NEEDLE LOCALIZATION;  Surgeon: Robert Bellow, MD;  Location: [facility] ORS;  Service: General;  Laterality: Left;  . BREAST LUMPECTOMY Left 08/2014   DCIS  . BREAST MAMMOSITE Left 10/07/2014   Procedure: MAMMOSITE BREAST;  Surgeon: Robert Bellow, MD;  Location: [facility] ORS;  Service: General;  Laterality: Left;   Social History   Social History  . Marital status: Single     Spouse name: N/A  . Number of children: N/A  . Years of education: N/A   Occupational History  . Not on file.   Social History Main Topics  . Smoking status: Never Smoker  . Smokeless tobacco: Never Used  . Alcohol use No  . Drug use: No  . Sexual activity: Yes    Birth control/ protection: None   Other Topics Concern  . Not on file   Social History Narrative  . No narrative on file   Social History   Social History Narrative  . No narrative on file     ROS: Negative.     PE: HEENT: Negative. Lungs: Clear. Cardio: RR.  Assessment/Plan:  Proceed with planned endoscopy.   Robert Bellow 09/21/2016

## 2016-09-21 NOTE — Anesthesia Preprocedure Evaluation (Signed)
Anesthesia Evaluation  Patient identified by MRN, date of birth, ID band Patient awake    Reviewed: Allergy & Precautions, NPO status , Patient's Chart, lab work & pertinent test results  History of Anesthesia Complications Negative for: history of anesthetic complications  Airway Mallampati: II  TM Distance: >3 FB Neck ROM: Full    Dental no notable dental hx.    Pulmonary sleep apnea and Continuous Positive Airway Pressure Ventilation , neg COPD,    breath sounds clear to auscultation- rhonchi (-) wheezing      Cardiovascular Exercise Tolerance: Good (-) hypertension(-) CAD and (-) Past MI  Rhythm:Regular Rate:Normal - Systolic murmurs and - Diastolic murmurs    Neuro/Psych negative neurological ROS  negative psych ROS   GI/Hepatic negative GI ROS, Neg liver ROS,   Endo/Other  negative endocrine ROSneg diabetes  Renal/GU negative Renal ROS     Musculoskeletal negative musculoskeletal ROS (+)   Abdominal (+) + obese,   Peds  Hematology  (+) anemia ,   Anesthesia Other Findings Past Medical History: No date: Anemia 08/26/2014: Breast cancer ([place])     Comment: Left breast DCIS, ER positive, PR positive.               With mammosite rad tx No date: Elevated blood pressure     Comment: PT GOING [facility]11[facility]14 FOR RECHECK               PF BP TO DETERMINE IT BP MEDS NEED TO BE               STARTED No date: Sleep apnea   Reproductive/Obstetrics                             Anesthesia Physical Anesthesia Plan  ASA: II  Anesthesia Plan: General   Post-op Pain Management:    Induction: Intravenous  PONV Risk Score and Plan: 2 and Propofol  Airway Management Planned: Natural Airway  Additional Equipment:   Intra-op Plan:   Post-operative Plan:   Informed Consent: I have reviewed the patients History and Physical, chart, labs and discussed the procedure including the  risks, benefits and alternatives for the proposed anesthesia with the patient or authorized representative who has indicated his/her understanding and acceptance.   Dental advisory given  Plan Discussed with: CRNA and Anesthesiologist  Anesthesia Plan Comments:         Anesthesia Quick Evaluation

## 2016-09-21 NOTE — Transfer of Care (Signed)
Immediate Anesthesia Transfer of Care Note  Patient: Felicia Ford  Procedure(s) Performed: Procedure(s): COLONOSCOPY WITH PROPOFOL (N/A)  Patient Location: PACU and Endoscopy Unit  Anesthesia Type:General  Level of Consciousness: awake, alert  and oriented  Airway & Oxygen Therapy: Patient Spontanous Breathing and Patient connected to nasal cannula oxygen  Post-op Assessment: Report given to RN and Post -op Vital signs reviewed and stable  Post vital signs: Reviewed and stable  Last Vitals:  Vitals:   09/21/16 0753  BP: (!) 142/90  Pulse: 97  Resp: 16  Temp: (!) 35.8 C    Last Pain:  Vitals:   09/21/16 0753  TempSrc: Tympanic         Complications: No apparent anesthesia complications

## 2016-09-21 NOTE — Op Note (Signed)
[facility] Gastroenterology Patient Name: Felicia Ford Procedure Date: 09/21/2016 8:23 AM MRN: [id number] Account #: [id number] Date of Birth: Jun 29, 1965 Admit Type: Outpatient Age: 51 Room: [facility] ENDO ROOM 1 Gender: Female Note Status: Finalized Procedure:            Colonoscopy Indications:          Screening for colorectal malignant neoplasm Providers:            Robert Bellow, MD Referring MD:         Lorelee Market (Referring MD) Medicines:            Monitored Anesthesia Care Complications:        No immediate complications. Procedure:            Pre-Anesthesia Assessment:                       - Prior to the procedure, a History and Physical was                        performed, and patient medications, allergies and                        sensitivities were reviewed. The patient's tolerance of                        previous anesthesia was reviewed.                       - The risks and benefits of the procedure and the                        sedation options and risks were discussed with the                        patient. All questions were answered and informed                        consent was obtained.                       After obtaining informed consent, the colonoscope was                        passed under direct vision. Throughout the procedure,                        the patient's blood pressure, pulse, and oxygen                        saturations were monitored continuously. The Olympus                        PCF-H180AL colonoscope ( S#: [id number] ) was introduced                        through the anus and advanced to the the terminal                        ileum. The colonoscopy was performed without  difficulty. The patient tolerated the procedure well.                        The quality of the bowel preparation was excellent. Findings:      The entire examined colon appeared normal on direct and retroflexion        views. Impression:           - The entire examined colon is normal on direct and                        retroflexion views.                       - No specimens collected. Recommendation:       - Discharge patient to home (via wheelchair).                       - Repeat colonoscopy in 10 years for screening purposes. Procedure Code(s):    --- Professional ---                       [phone], Colonoscopy, flexible; diagnostic, including                        collection of specimen(s) by brushing or washing, when                        performed (separate procedure) Diagnosis Code(s):    --- Professional ---                       Z12.11, Encounter for screening for malignant neoplasm                        of colon CPT copyright 2016 American [employer]. All rights reserved. The codes documented in this report are preliminary and upon coder review may  be revised to meet current compliance requirements. Robert Bellow, MD 09/21/2016 8:55:20 AM This report has been signed electronically. Number of Addenda: 0 Note Initiated On: 09/21/2016 8:23 AM Scope Withdrawal Time: 0 hours 15 minutes 38 seconds  Total Procedure Duration: 0 hours 21 minutes 8 seconds       [facility]

## 2016-09-22 ENCOUNTER — Encounter: Payer: Self-pay | Admitting: General Surgery

## 2016-10-03 ENCOUNTER — Telehealth: Payer: Self-pay | Admitting: *Deleted

## 2016-10-03 NOTE — Telephone Encounter (Signed)
Patient is taking naproxen 500mg  and flexeril 10mg  tablets for shoulder and neck pain. She was prescribed these medications in the ER and she is out of them and its the only things that stop her pain. She doesn't have a primary care doctor and didn't know if you can prescribe these for her or does she need to go see a specialist

## 2016-10-06 NOTE — Telephone Encounter (Signed)
Naprosyn is available over the counter as Aleve. Two tablets twice a day equals what she is taking as a prescription.  Flexeril is only for short term use.

## 2016-10-07 NOTE — Telephone Encounter (Signed)
Patient notified of Aleve OTC that she may take. Notified if she feels like she still needs the Flexeril to be seen by her PCP as she may need further work up for this. She expresses understanding.

## 2016-10-15 IMAGING — US US TRANSVAGINAL NON-OB
1 series · 13 of 25 positions shown · non-contrast
Comparison: Pelvic ultrasound 01/12/2008.

CLINICAL DATA: 49-year-old female with heavy vaginal bleeding for
the past 2 weeks. Patient has been on tamoxifen for the past 3-4
months for a history breast cancer.

EXAM:
TRANSABDOMINAL AND TRANSVAGINAL ULTRASOUND OF PELVIS
TECHNIQUE: Both transabdominal and transvaginal ultrasound examinations of the
pelvis were performed. Transabdominal technique was performed for
global imaging of the pelvis including uterus, ovaries, adnexal
regions, and pelvic cul-de-sac.
It was necessary to proceed with endovaginal exam following the
transabdominal exam to visualize the adnexal regions.

[Series 1: us transvaginal non-ob · 0.29mm/px · 13 of 131 slices shown]
[im 1/131]
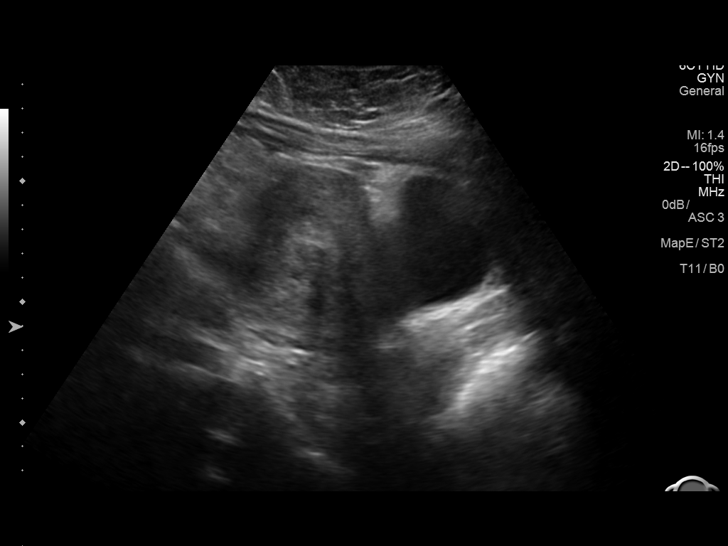
[im 11/131]
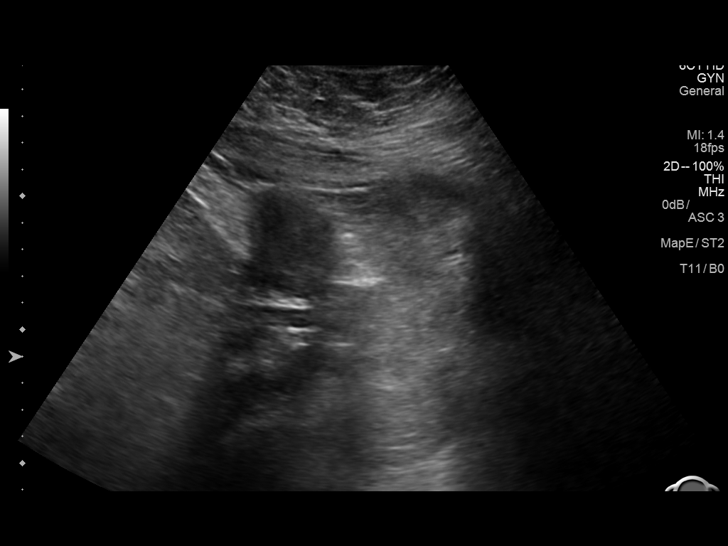
[im 22/131]
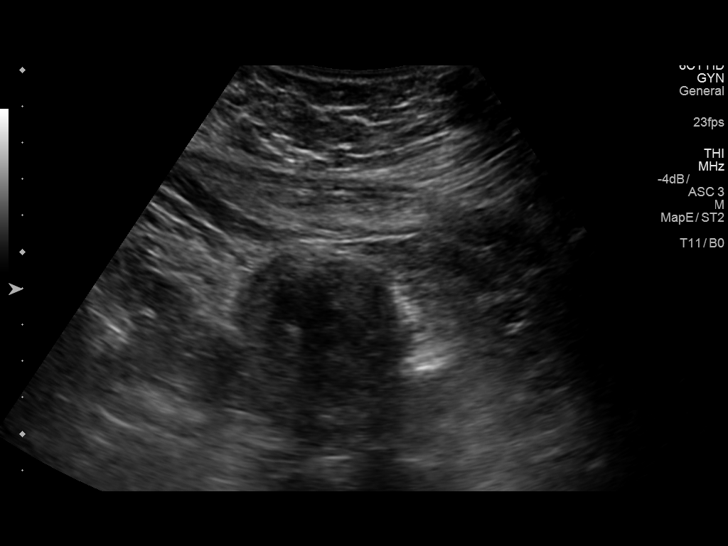
[im 33/131]
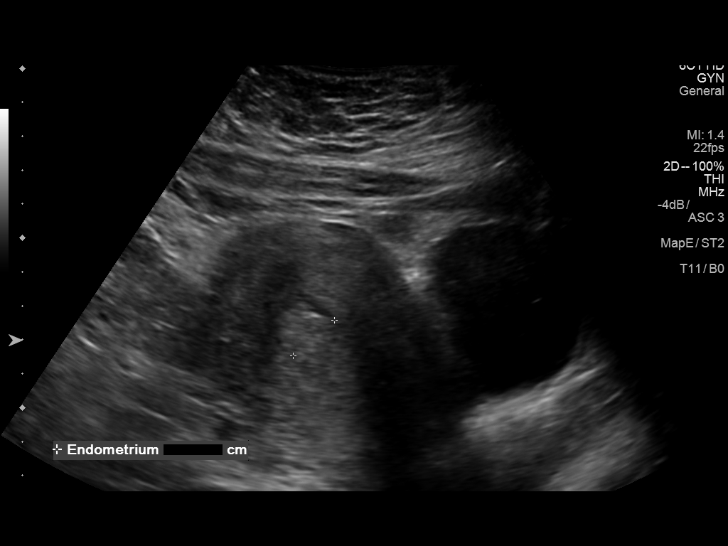
[im 44/131]
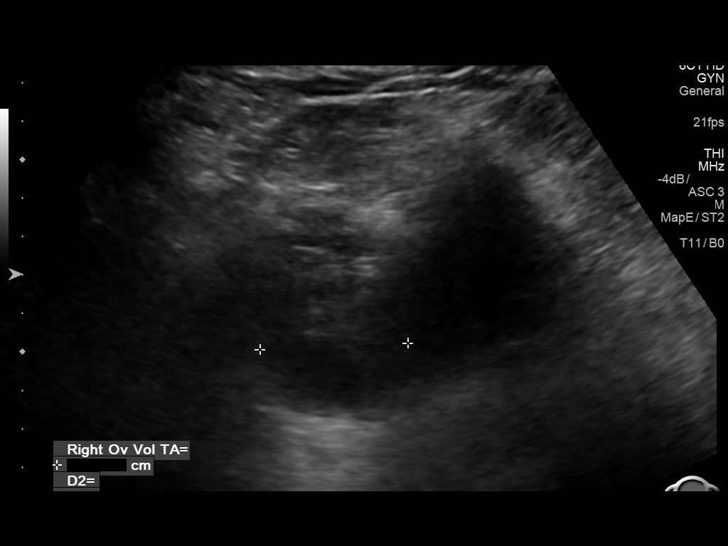
[im 55/131]
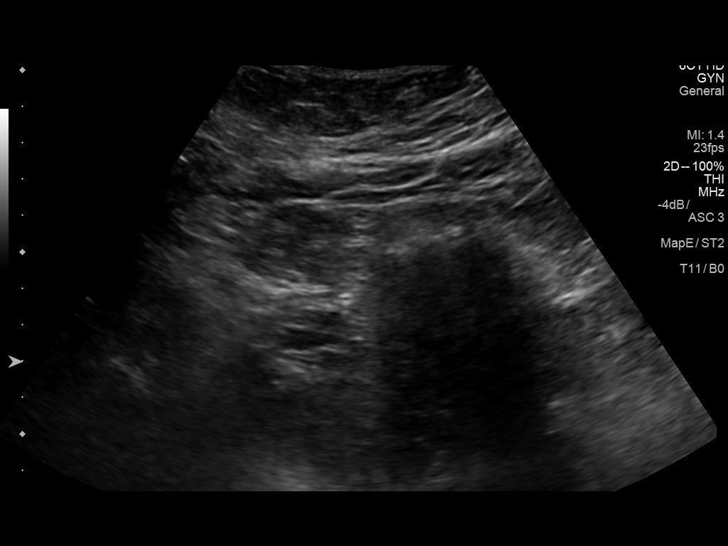
[im 66/131]
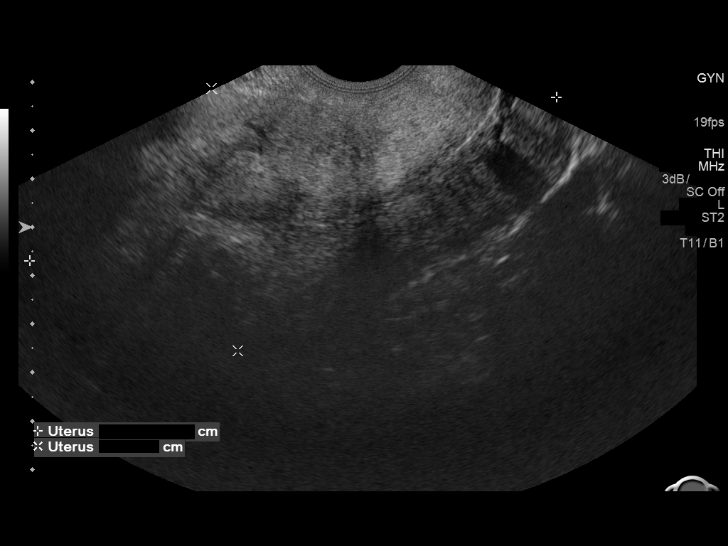
[im 76/131]
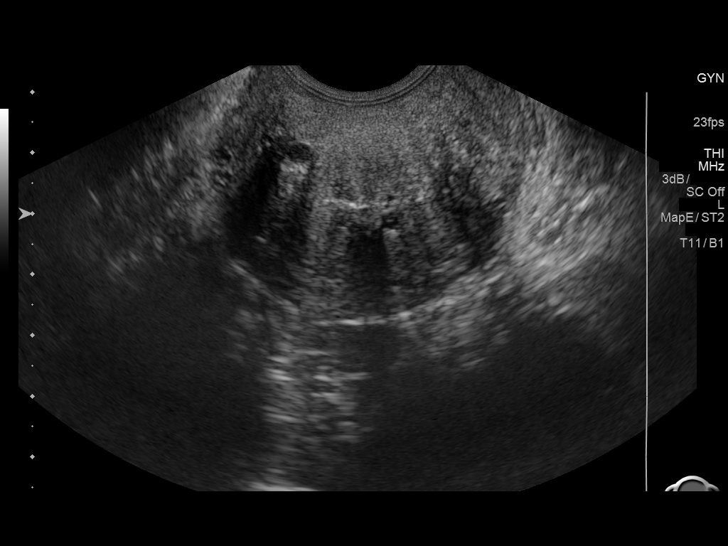
[im 87/131]
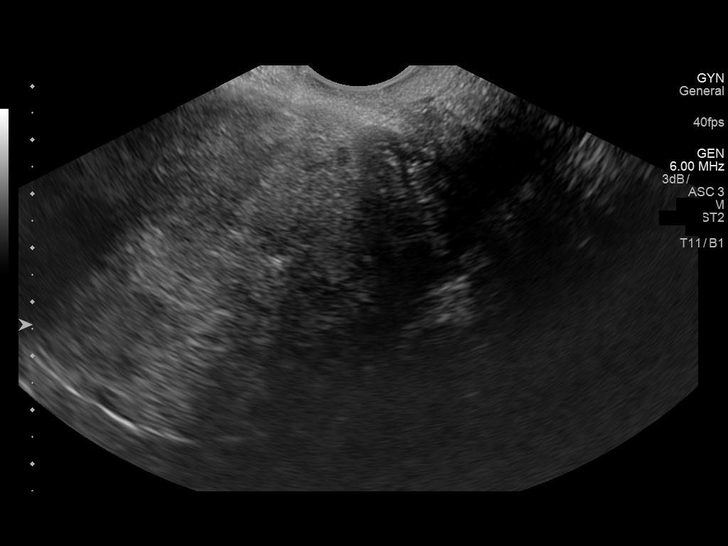
[im 98/131]
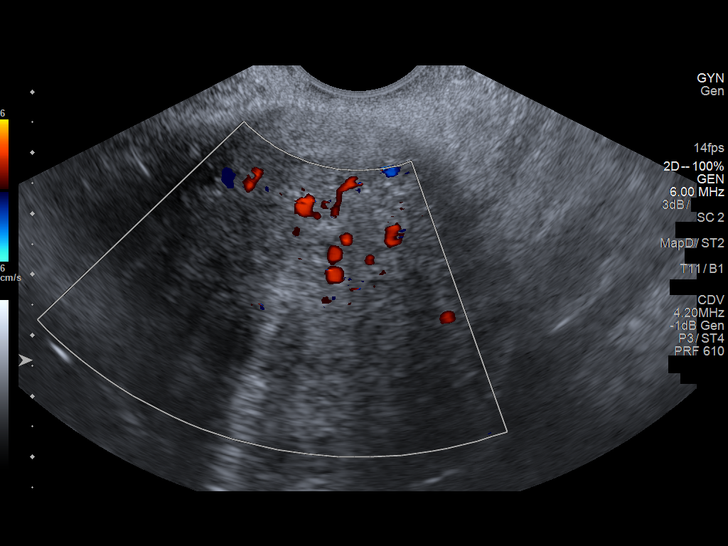
[im 109/131]
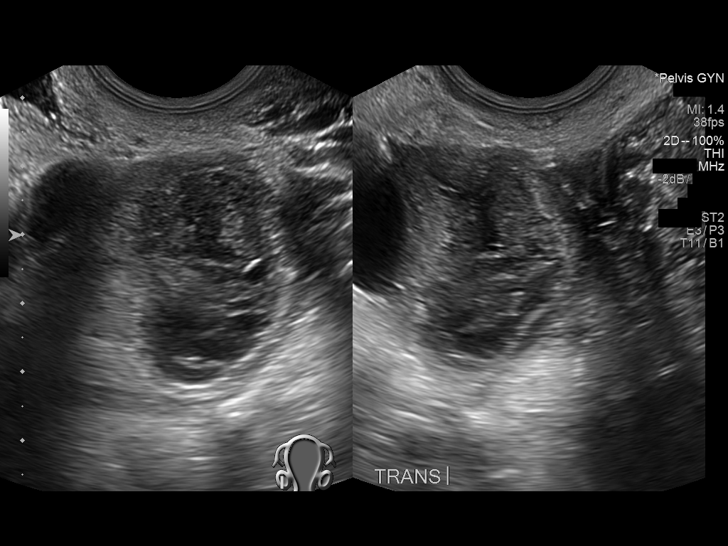
[im 120/131]
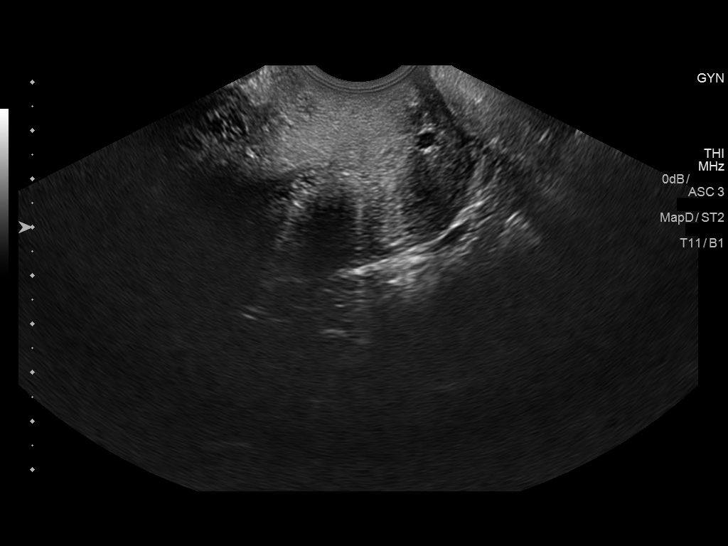
[im 131/131]
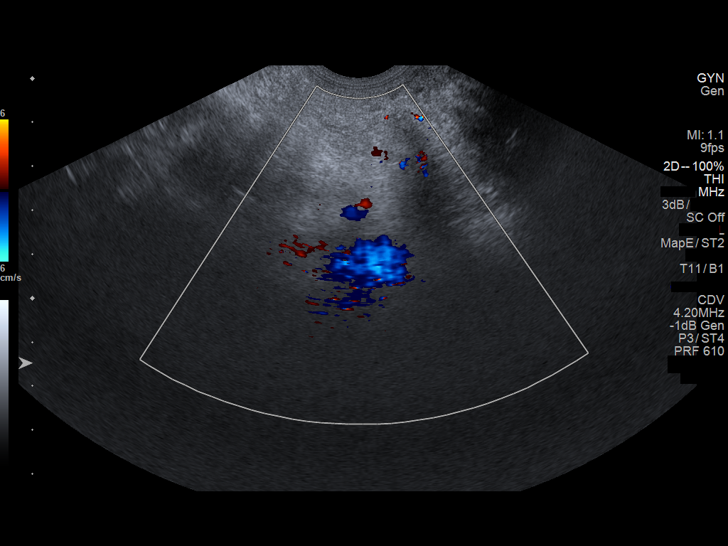

[13 of 25 positions shown; findings below may reference images not displayed]

FINDINGS: Uterus

Measurements: 12.7 x 6.3 x 7.0 cm. Multiple small heterogeneously
hypoechoic lesions with some posterior acoustic shadowing, most
compatible with fibroids ring measuring up to 1.6 x 1.6 x 1.3 cm in
the right-sided the uterine body and lower uterine segment. Small
submucosal fibroid in the right-sided the uterine body measuring
x 1.3 x 1.4 cm.

Endometrium

Thickness: 1.7 cm.  No focal abnormality visualized.

Right ovary

Measurements: 5.3 x 3.9 x 3.7 cm. Heterogeneously echogenic lesion
with increased through transmission in the right ovary measuring
x 3.6 x 2.6 cm, which has no internal blood flow, and is most
characteristic of a hemorrhagic cyst.

Left ovary

Measurements: 2.2 x 2.7 x 3.0 cm. Normal appearance/no adnexal mass.

Other findings

No abnormal free fluid.
IMPRESSION: 1. Fibroid uterus, as above.
2. Probable hemorrhagic cyst in the right ovary. Short-interval
follow up ultrasound in 6-12 weeks is recommended, preferably during
the week following the patient's normal menses.

## 2016-10-18 ENCOUNTER — Ambulatory Visit
Admission: RE | Admit: 2016-10-18 | Discharge: 2016-10-18 | Disposition: A | Discharge status: Home / Self Care | Payer: Medicaid Other | Source: Ambulatory Visit | Attending: General Surgery | Admitting: General Surgery

## 2016-10-18 ENCOUNTER — Other Ambulatory Visit: Payer: Self-pay | Admitting: General Surgery

## 2016-10-18 DIAGNOSIS — D0512 Intraductal carcinoma in situ of left breast: Secondary | ICD-10-CM

## 2016-10-18 DIAGNOSIS — Z853 Personal history of malignant neoplasm of breast: Secondary | ICD-10-CM | POA: Diagnosis present

## 2016-12-22 ENCOUNTER — Other Ambulatory Visit: Payer: Self-pay | Admitting: General Surgery

## 2016-12-22 DIAGNOSIS — D0512 Intraductal carcinoma in situ of left breast: Secondary | ICD-10-CM

## 2017-05-02 ENCOUNTER — Encounter: Payer: Self-pay | Admitting: Emergency Medicine

## 2017-05-02 ENCOUNTER — Emergency Department
Admission: EM | Admit: 2017-05-02 | Discharge: 2017-05-02 | Disposition: A | Discharge status: Home / Self Care | Payer: Medicaid Other | Attending: Student in an Organized Health Care Education/Training Program | Admitting: Student in an Organized Health Care Education/Training Program

## 2017-05-02 ENCOUNTER — Other Ambulatory Visit: Payer: Self-pay

## 2017-05-02 DIAGNOSIS — G43809 Other migraine, not intractable, without status migrainosus: Secondary | ICD-10-CM

## 2017-05-02 DIAGNOSIS — Z79899 Other long term (current) drug therapy: Secondary | ICD-10-CM | POA: Insufficient documentation

## 2017-05-02 DIAGNOSIS — R51 Headache: Secondary | ICD-10-CM | POA: Diagnosis not present

## 2017-05-02 DIAGNOSIS — Z7982 Long term (current) use of aspirin: Secondary | ICD-10-CM | POA: Insufficient documentation

## 2017-05-02 DIAGNOSIS — R519 Headache, unspecified: Secondary | ICD-10-CM

## 2017-05-02 MED ORDER — DIPHENHYDRAMINE HCL 50 MG/ML IJ SOLN
25.0000 mg | Freq: Once | INTRAMUSCULAR | Status: AC
  Administered 2017-05-02: 25 mg via INTRAVENOUS
  Filled 2017-05-02: qty 1

## 2017-05-02 MED ORDER — BUTALBITAL-APAP-CAFFEINE 50-325-40 MG PO TABS: 1.0000 | ORAL_TABLET | Freq: Four times a day (QID) | ORAL | 0 refills | Status: DC | PRN

## 2017-05-02 MED ORDER — PROCHLORPERAZINE EDISYLATE 5 MG/ML IJ SOLN
10.0000 mg | Freq: Once | INTRAMUSCULAR | Status: AC
  Administered 2017-05-02: 10 mg via INTRAVENOUS
  Filled 2017-05-02: qty 2

## 2017-05-02 MED ORDER — SODIUM CHLORIDE 0.9 % IV BOLUS (SEPSIS)
500.0000 mL | Freq: Once | INTRAVENOUS | Status: AC
  Administered 2017-05-02: 500 mL via INTRAVENOUS

## 2017-05-02 MED ORDER — LIDOCAINE HCL (PF) 1 % IJ SOLN
INTRAMUSCULAR | Status: AC
  Filled 2017-05-02: qty 5

## 2017-05-02 MED ORDER — ACETAMINOPHEN 500 MG PO TABS
1000.0000 mg | ORAL_TABLET | Freq: Once | ORAL | Status: AC
  Administered 2017-05-02: 1000 mg via ORAL
  Filled 2017-05-02: qty 2

## 2017-05-02 NOTE — ED Triage Notes (Signed)
Headache since yesterday

## 2017-05-02 NOTE — ED Notes (Signed)
Esign not working, pt verbalized discharge instructions and has no questions at this time

## 2017-05-02 NOTE — ED Provider Notes (Signed)
-----------------------------------------   6:38 PM on 05/02/2017 -----------------------------------------  Patient states her headache is gone at this time.  We will discharge with Fioricet for any return of mild headache.  Patient will follow up with her doctor regarding her migraines.  States she used to have migraines very frequently but thought she has grown out of them until recently over the past several months she has had recurrence of migraine headaches.   Harvest Dark, MD 05/02/17 [phone]

## 2017-05-02 NOTE — ED Provider Notes (Signed)
[facility] Emergency Department Provider Note    None    (approximate)  I have reviewed the triage vital signs and the nursing notes.   HISTORY  Chief Complaint Headache    HPI Felicia Ford is a 52 y.o. female with a history of migraine headaches presents with 2 days of headache.  States is gradual in onset.  No fevers states that she did have aura once yesterday and once early this morning.  Has tried Tylenol without improvement.  Did used to take Excedrin Migraine but is not taking that recently.  Does endorse photophobia and phonophobia.  No numbness or tingling.  Past Medical History:  Diagnosis Date  . Anemia   . Breast cancer ([place]) 08/26/2014   Left breast DCIS, ER positive, PR positive. With mammosite rad tx  . Elevated blood pressure    PT GOING TO [facility] 09-11[facility]14 FOR RECHECK PF BP TO DETERMINE IT BP MEDS NEED TO BE STARTED  . Sleep apnea    Family History  Problem Relation Age of Onset  . Diabetes Mother   . Lupus Sister   . Lung cancer Maternal Uncle   . Breast cancer Neg Hx    Past Surgical History:  Procedure Laterality Date  . BREAST BIOPSY Left 08/26/2014   DCIS  . BREAST BIOPSY Left 09/18/2014   Procedure: BREAST BIOPSY WITH NEEDLE LOCALIZATION;  Surgeon: Robert Bellow, MD;  Location: [facility] ORS;  Service: General;  Laterality: Left;  . BREAST LUMPECTOMY Left 08/2014   DCIS  . BREAST MAMMOSITE Left 10/07/2014   Procedure: MAMMOSITE BREAST;  Surgeon: Robert Bellow, MD;  Location: [facility] ORS;  Service: General;  Laterality: Left;  . COLONOSCOPY WITH PROPOFOL N/A 09/21/2016   Procedure: COLONOSCOPY WITH PROPOFOL;  Surgeon: Robert Bellow, MD;  Location: [facility] ENDOSCOPY;  Service: Endoscopy;  Laterality: N/A;   Patient Active Problem List   Diagnosis Date Noted  . Encounter for screening colonoscopy 08/10/2016  . Menorrhagia with regular cycle 03/31/2015  . Use of tamoxifen (Nolvadex) 03/31/2015  . Obesity, morbid,  BMI 50 or higher ([place]) 03/31/2015  . Perimenopausal vasomotor symptoms 01/21/2015  . 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 Authorizing Provider  aspirin 81 MG tablet Take 81 mg by mouth daily.    [provider]  cyclobenzaprine (FLEXERIL) 10 MG tablet Take 1 tablet (10 mg total) by mouth 3 (three) times daily as needed for muscle spasms. 05/25/16   Triplett, Johnette Abraham B, FNP  naproxen (NAPROSYN) 500 MG tablet Take 1 tablet (500 mg total) by mouth 2 (two) times daily with a meal. 05/25/16   Triplett, Cari B, FNP  tamoxifen (NOLVADEX) 20 MG tablet TAKE 1 TABLET(20 MG) BY MOUTH DAILY 12/22/16   Byrnett, Forest Gleason, MD    Allergies Latex    Social History Social History   Tobacco Use  . Smoking status: Never Smoker  . Smokeless tobacco: Never Used  Substance Use Topics  . Alcohol use: No    Alcohol/week: 0.0 oz  . Drug use: No    Review of Systems Patient denies headaches, rhinorrhea, blurry vision, numbness, shortness of breath, chest pain, edema, cough, abdominal pain, nausea, vomiting, diarrhea, dysuria, fevers, rashes or hallucinations unless otherwise stated above in HPI. ____________________________________________   PHYSICAL EXAM:  VITAL SIGNS: Vitals:   05/02/17 1523  Pulse: 92  Resp: 20  Temp: 98.8 F (  37.1 C)  SpO2: 99%    Constitutional: Alert and oriented. Well appearing and in no acute distress. Eyes: Conjunctivae are normal.  Head: Atraumatic. Nose: No congestion/rhinnorhea. Mouth/Throat: Mucous membranes are moist.   Neck: No stridor. Painless ROM.  Cardiovascular: Normal rate, regular rhythm. Grossly normal heart sounds.  Good peripheral circulation. Respiratory: Normal respiratory effort.  No retractions. Lungs CTAB. Gastrointestinal: Soft and nontender. No distention. No abdominal bruits. No CVA tenderness. Genitourinary:  Musculoskeletal: No lower extremity  tenderness nor edema.  No joint effusions. Neurologic:  Normal speech and language. No gross focal neurologic deficits are appreciated. No facial droop Skin:  Skin is warm, dry and intact. No rash noted. Psychiatric: Mood and affect are normal. Speech and behavior are normal.  ____________________________________________   LABS (all labs ordered are listed, but only abnormal results are displayed)  No results found for this or any previous visit (from the past 24 hour(s)). ____________________________________________ _____________________________   PROCEDURES  Procedure(s) performed:  Procedures  Due to difficulty with obtaining IV access, a 20G peripheral IV catheter was inserted using US guidance into the left AC.  The site was prepped with chlorhexidine and allowed to dry.  The patient tolerated the procedure without any complications.   Critical Care performed: no ____________________________________________   INITIAL IMPRESSION / ASSESSMENT AND PLAN / ED COURSE  Pertinent labs & imaging results that were available during my care of the patient were reviewed by me and considered in my medical decision making (see chart for details).  DDX: migraine, tension, cluster, status migrainosis, sah, iph, meningitis  Felicia Ford is a 52 y.o. who presents to the ED with  with Hx of migraine p/w HA for last 2 days. Not worst HA ever. Gradual onset. HA similar to previous episodes. Denies focal neurologic symptoms. Denies trauma. No fevers or neck pain. No vision loss. Afebrile in ED. VSS. Exam as above. No meningeal signs. No CN, motor, sensory or cerebellar deficits. Temporal arteries palpable and non-tender. Appears well and non-toxic.  Will provide IV fluids for hydration and IV medications for symptom control.  Likely tension, non-specific or possible migraine HA. Clinical picture is not consistent with ICH, SAH, SDH, EDH, TIA, or CVA. No concern for meningitis or encephalitis. No  concern for GCA/Temporal arteritis.  Patient will be signed out to Dr. Kerman Passey pending reassessment.  Anticipate DC home.       ____________________________________________   FINAL CLINICAL IMPRESSION(S) / ED DIAGNOSES  Final diagnoses:  Bad headache      NEW MEDICATIONS STARTED DURING THIS VISIT:  New Prescriptions   No medications on file     Note:  This document was prepared using Dragon voice recognition software and may include unintentional dictation errors.    Merlyn Lot, MD 05/02/17 [phone]

## 2017-05-02 NOTE — ED Notes (Signed)
Pt presents today with HA. Pt states she has a hx of migraines. Pt states vision was blurred earlier today. Pt states she has taken tylenol with no relief. Pt is NAD lights are dimmed per request. Awaiting EDP.

## 2017-08-14 ENCOUNTER — Other Ambulatory Visit: Payer: Self-pay

## 2017-08-14 DIAGNOSIS — D0512 Intraductal carcinoma in situ of left breast: Secondary | ICD-10-CM

## 2017-10-24 ENCOUNTER — Ambulatory Visit
Admission: RE | Admit: 2017-10-24 | Discharge: 2017-10-24 | Disposition: A | Discharge status: Home / Self Care | Payer: Medicaid Other | Source: Ambulatory Visit | Attending: General Surgery | Admitting: General Surgery

## 2017-10-24 DIAGNOSIS — D0512 Intraductal carcinoma in situ of left breast: Secondary | ICD-10-CM

## 2017-10-24 HISTORY — DX: Personal history of irradiation: Z92.3

## 2017-10-31 ENCOUNTER — Ambulatory Visit: Payer: Medicaid Other | Admitting: General Surgery

## 2017-10-31 ENCOUNTER — Encounter: Payer: Self-pay | Admitting: General Surgery

## 2017-10-31 VITALS — BP 136/70 | HR 82 | Resp 14 | Ht 65.0 in | Wt 285.0 lb

## 2017-10-31 DIAGNOSIS — D0512 Intraductal carcinoma in situ of left breast: Secondary | ICD-10-CM | POA: Diagnosis not present

## 2017-10-31 NOTE — Progress Notes (Signed)
Patient ID: Felicia Ford, female   DOB: Apr 10, 1965, 52 y.o.   MRN: [id number]  Chief Complaint  Patient presents with  . Follow-up    HPI Felicia Ford is a 52 y.o. female who presents for a breast evaluation. The most recent mammogram was done on 10/24/2017. Doing well on Tamoxifen.  Patient does perform regular self breast checks and gets regular mammograms done.    HPI  Past Medical History:  Diagnosis Date  . Anemia   . Breast cancer ([place]) 08/26/2014   Left breast DCIS, ER positive, PR positive. With mammosite rad tx  . Elevated blood pressure    PT GOING TO [facility]11[facility]14 FOR RECHECK PF BP TO DETERMINE IT BP MEDS NEED TO BE STARTED  . Personal history of radiation therapy 10/07/2014   mammosite  . Sleep apnea     Past Surgical History:  Procedure Laterality Date  . BREAST BIOPSY Left 08/26/2014   DCIS  . BREAST BIOPSY Left 09/18/2014   Procedure: BREAST BIOPSY WITH NEEDLE LOCALIZATION;  Surgeon: Robert Bellow, MD;  Location: [facility] ORS;  Service: General;  Laterality: Left;  . BREAST LUMPECTOMY Left 09/18/2014   DCIS with clear margins  . BREAST MAMMOSITE Left 10/07/2014   Procedure: MAMMOSITE BREAST;  Surgeon: Robert Bellow, MD;  Location: [facility] ORS;  Service: General;  Laterality: Left;  . COLONOSCOPY WITH PROPOFOL N/A 09/21/2016   Procedure: COLONOSCOPY WITH PROPOFOL;  Surgeon: Robert Bellow, MD;  Location: [facility] ENDOSCOPY;  Service: Endoscopy;  Laterality: N/A;    Family History  Problem Relation Age of Onset  . Diabetes Mother   . Lupus Sister   . Lung cancer Maternal Uncle   . Breast cancer Neg Hx     Social History Social History   Tobacco Use  . Smoking status: Never Smoker  . Smokeless tobacco: Never Used  Substance Use Topics  . Alcohol use: No    Alcohol/week: 0.0 oz  . Drug use: No    Allergies  Allergen Reactions  . Latex     Current Outpatient Medications  Medication Sig Dispense Refill  . aspirin 81 MG tablet Take 81 mg  by mouth daily.    . tamoxifen (NOLVADEX) 20 MG tablet TAKE 1 TABLET(20 MG) BY MOUTH DAILY 30 tablet 6   No current facility-administered medications for this visit.     Review of Systems Review of Systems  Constitutional: Negative.   Respiratory: Negative.   Cardiovascular: Negative.     Blood pressure 136/70, pulse 82, resp. rate 14, height 5\' 5"  (1.651 m), weight 285 lb (129.3 kg).  Physical Exam Physical Exam  Constitutional: She is oriented to person, place, and time. She appears well-developed and well-nourished.  Eyes: Conjunctivae are normal. No scleral icterus.  Neck: Normal range of motion.  Cardiovascular: Normal rate, regular rhythm and normal heart sounds.  Pulmonary/Chest: Effort normal and breath sounds normal. Right breast exhibits no inverted nipple, no mass, no nipple discharge, no skin change and no tenderness. Left breast exhibits no inverted nipple, no mass, no nipple discharge, no skin change and no tenderness.    Lymphadenopathy:    She has no cervical adenopathy.    She has no axillary adenopathy.       Left: No supraclavicular adenopathy present.  Neurological: She is alert and oriented to person, place, and time.  Skin: Skin is warm and dry.    Data Reviewed October 24, 2017 bilateral diagnostic mammograms were reviewed.  Postsurgical changes.  BI-RADS-2.  Diagnostic study recommended 1 year.  Assessment    Doing well now 3 years out for management of DCIS involving the left breast.    Plan  The patient has been asked to return to the office in one year with a bilateral diagnostic mammogram.The patient is aware to call back for any questions or concerns.    HPI, Physical Exam, Assessment and Plan have been scribed under the direction and in the presence of Hervey Ard, MD.  Gaspar Cola, CMA  I have completed the exam and reviewed the above documentation for accuracy and completeness.  I agree with the above.  Haematologist has been  used and any errors in dictation or transcription are unintentional.  Hervey Ard, M.D., F.A.C.S.  Forest Gleason Byrnett 11/01/2017, 6:13 PM

## 2017-10-31 NOTE — Patient Instructions (Signed)
The patient has been asked to return to the office in one year with a bilateral diagnostic mammogram.The patient is aware to call back for any questions or concerns. 

## 2018-07-10 ENCOUNTER — Ambulatory Visit: Payer: Medicaid Other | Admitting: General Surgery

## 2018-07-28 ENCOUNTER — Other Ambulatory Visit: Payer: Self-pay | Admitting: General Surgery

## 2018-07-28 DIAGNOSIS — D0512 Intraductal carcinoma in situ of left breast: Secondary | ICD-10-CM

## 2018-09-24 ENCOUNTER — Other Ambulatory Visit: Payer: Self-pay | Admitting: *Deleted

## 2018-09-24 DIAGNOSIS — D0512 Intraductal carcinoma in situ of left breast: Secondary | ICD-10-CM

## 2018-10-15 ENCOUNTER — Encounter: Payer: Self-pay | Admitting: General Surgery

## 2018-10-22 ENCOUNTER — Other Ambulatory Visit: Payer: Self-pay

## 2018-10-22 DIAGNOSIS — D0512 Intraductal carcinoma in situ of left breast: Secondary | ICD-10-CM

## 2018-10-23 ENCOUNTER — Telehealth: Payer: Self-pay | Admitting: *Deleted

## 2018-10-23 NOTE — Telephone Encounter (Signed)
Patient given information for [facility].

## 2018-10-23 NOTE — Telephone Encounter (Signed)
Patient called and wanted to know who we recommend to have breast reductions and if so do they need a referral? Please call and advise

## 2018-11-16 ENCOUNTER — Ambulatory Visit
Admission: RE | Admit: 2018-11-16 | Discharge: 2018-11-16 | Disposition: A | Discharge status: Home / Self Care | Payer: Medicaid Other | Source: Ambulatory Visit | Attending: Surgery | Admitting: Surgery

## 2018-11-16 ENCOUNTER — Other Ambulatory Visit: Payer: Self-pay

## 2018-11-16 DIAGNOSIS — D0512 Intraductal carcinoma in situ of left breast: Secondary | ICD-10-CM

## 2018-11-26 ENCOUNTER — Telehealth: Payer: Self-pay

## 2018-11-26 NOTE — Telephone Encounter (Signed)

## 2018-11-27 ENCOUNTER — Institutional Professional Consult (permissible substitution): Payer: Medicaid Other | Admitting: Plastic Surgery

## 2018-12-05 ENCOUNTER — Ambulatory Visit: Payer: Medicaid Other | Admitting: Surgery

## 2018-12-11 ENCOUNTER — Ambulatory Visit: Payer: Medicaid Other | Admitting: Surgery

## 2018-12-11 ENCOUNTER — Other Ambulatory Visit: Payer: Self-pay

## 2018-12-11 ENCOUNTER — Encounter: Payer: Self-pay | Admitting: Surgery

## 2018-12-11 VITALS — BP 143/85 | HR 76 | Temp 96.6°F | Resp 14 | Ht 62.0 in | Wt 306.6 lb

## 2018-12-11 DIAGNOSIS — D0512 Intraductal carcinoma in situ of left breast: Secondary | ICD-10-CM | POA: Diagnosis not present

## 2018-12-11 NOTE — Patient Instructions (Signed)
WE will contact you July 2021 to schedule mammogram and breast exam for August 2021. Please call our office if you have questions or concerns.

## 2018-12-11 NOTE — Progress Notes (Signed)
12/11/2018  History of Present Illness: Felicia Ford is a 53 y.o. female following from left breast DCIS s/p lumpectomy in 08/2014.  She had her mammogram on 11/16/18.  She denies any masses, skin changes, or nipple changes.  She reports occasional burning at the incision site.  Past Medical History: Past Medical History:  Diagnosis Date  . Anemia   . Breast cancer ([place]) 08/26/2014   Left breast DCIS, ER positive, PR positive. With mammosite rad tx  . Elevated blood pressure    PT GOING TO [facility]14 FOR RECHECK PF BP TO DETERMINE IT BP MEDS NEED TO BE STARTED  . Personal history of radiation therapy 10/07/2014   mammosite  . Sleep apnea      Past Surgical History: Past Surgical History:  Procedure Laterality Date  . BREAST BIOPSY Left 08/26/2014   DCIS  . BREAST BIOPSY Left 09/18/2014   Procedure: BREAST BIOPSY WITH NEEDLE LOCALIZATION;  Surgeon: Robert Bellow, MD;  Location: [facility] ORS;  Service: General;  Laterality: Left;  . BREAST LUMPECTOMY Left 09/18/2014   DCIS with clear margins  . BREAST MAMMOSITE Left 10/07/2014   Procedure: MAMMOSITE BREAST;  Surgeon: Robert Bellow, MD;  Location: [facility] ORS;  Service: General;  Laterality: Left;  . COLONOSCOPY WITH PROPOFOL N/A 09/21/2016   Procedure: COLONOSCOPY WITH PROPOFOL;  Surgeon: Robert Bellow, MD;  Location: [facility] ENDOSCOPY;  Service: Endoscopy;  Laterality: N/A;    Home Medications: Prior to Admission medications   Medication Sig Start Date End Date Taking? Authorizing Provider  amLODipine (NORVASC) 5 MG tablet TK 1 T PO QD 07/29/18  Yes [provider]  aspirin 81 MG tablet Take 81 mg by mouth daily.   Yes [provider]  tamoxifen (NOLVADEX) 20 MG tablet TAKE 1 TABLET(20 MG) BY MOUTH DAILY 07/29/18  Yes Byrnett, Forest Gleason, MD    Allergies: Allergies  Allergen Reactions  . Latex     Review of Systems: Review of Systems  Constitutional: Negative for chills and fever.  Respiratory:  Negative for shortness of breath.   Cardiovascular: Negative for chest pain.  Gastrointestinal: Negative for nausea and vomiting.  Skin: Negative for rash.    Physical Exam BP (!) 143/85   Pulse 76   Temp (!) 96.6 F (35.9 C) (Temporal)   Resp 14   Ht 5\' 2"  (1.575 m)   Wt (!) 306 lb 9.6 oz (139.1 kg)   LMP 11/12/2018   SpO2 99%   BMI 56.08 kg/m  CONSTITUTIONAL: No acute distress HEENT:  Normocephalic, atraumatic, extraocular motion intact. RESPIRATORY:  Lungs are clear, and breath sounds are equal bilaterally. Normal respiratory effort without pathologic use of accessory muscles. CARDIOVASCULAR: Heart is regular without murmurs, gallops, or rubs. BREAST:  Left breast s/p lumpectomy in upper outer quadrant.  Incision well healed.  No palpable masses, skin changes, or nipple drainage or changes.  No left axillary or supraclavicular lymphadenopathy.  On the right, no palpable masses, skin changes, nipple changes or drainage.  No right axillary or supraclavicular lymphadenopathy. NEUROLOGIC:  Motor and sensation is grossly normal.  Cranial nerves are grossly intact. PSYCH:  Alert and oriented to person, place and time. Affect is normal.  Labs/Imaging: Mammogram 11/16/18: FINDINGS: Mammographically, there are no suspicious masses, areas of nonsurgical architectural distortion or microcalcifications in either breast. Stable posttreatment changes in the left breast.  Mammographic images were processed with CAD.  IMPRESSION: No mammographic evidence of malignancy in either breast, status post left lumpectomy.  Assessment and Plan: This is a 53 y.o. female s/p left breast lumpectomy in 08/2014 for DCIS  --Discussed mammogram results with patient.  Reassuring exam today as well. --Discussed that the burning sensation can be related to the scarring and radiation changes. --Follow up in 1 year with bilateral diagnostic mammogram.  Face-to-face time spent with the patient and care  providers was 15 minutes, with more than 50% of the time spent counseling, educating, and coordinating care of the patient.     Melvyn Neth, [facility]

## 2019-02-08 ENCOUNTER — Other Ambulatory Visit: Payer: Self-pay

## 2019-02-08 DIAGNOSIS — Z20822 Contact with and (suspected) exposure to covid-19: Secondary | ICD-10-CM

## 2019-02-09 LAB — NOVEL CORONAVIRUS, NAA: SARS-CoV-2, NAA: NOT DETECTED

## 2019-04-02 ENCOUNTER — Ambulatory Visit: Payer: Medicaid Other | Attending: Internal Medicine

## 2019-04-02 ENCOUNTER — Telehealth: Payer: Self-pay

## 2019-04-02 DIAGNOSIS — Z20822 Contact with and (suspected) exposure to covid-19: Secondary | ICD-10-CM

## 2019-04-02 NOTE — Telephone Encounter (Signed)
Received call from patient checking Covid results.  Advised no results at this time.   

## 2019-04-03 ENCOUNTER — Telehealth: Payer: Self-pay

## 2019-04-03 NOTE — Telephone Encounter (Signed)
Caller advise result not back yet 

## 2019-04-04 ENCOUNTER — Telehealth: Payer: Self-pay

## 2019-04-04 LAB — NOVEL CORONAVIRUS, NAA: SARS-CoV-2, NAA: DETECTED — AB

## 2019-04-04 NOTE — Telephone Encounter (Signed)
Patient called in requesting [place] lab results - DOB/Address verified - Results pending. Reviewed testing process with patient, reiterated not necessary to call since she has MyChart, no further questions.

## 2019-04-17 ENCOUNTER — Other Ambulatory Visit: Payer: Medicaid Other

## 2019-04-24 ENCOUNTER — Other Ambulatory Visit: Payer: Medicaid Other

## 2019-05-06 ENCOUNTER — Other Ambulatory Visit: Payer: Self-pay

## 2019-05-08 ENCOUNTER — Ambulatory Visit: Payer: Medicaid Other | Attending: Internal Medicine

## 2019-08-15 ENCOUNTER — Institutional Professional Consult (permissible substitution): Payer: Medicaid Other | Admitting: Plastic Surgery

## 2019-09-17 ENCOUNTER — Ambulatory Visit: Payer: Medicaid Other | Attending: Internal Medicine

## 2019-09-17 ENCOUNTER — Other Ambulatory Visit: Payer: Self-pay

## 2019-09-17 DIAGNOSIS — D0512 Intraductal carcinoma in situ of left breast: Secondary | ICD-10-CM

## 2019-09-17 DIAGNOSIS — Z23 Encounter for immunization: Secondary | ICD-10-CM

## 2019-09-17 NOTE — Progress Notes (Signed)
° °  Covid-19 Vaccination Clinic  Name:  Felicia Ford    MRN: [id number] DOB: 1965/07/03  09/17/2019  [name] was observed post Covid-19 immunization for 15 minutes without incident. She was provided with Vaccine Information Sheet and instruction to access the V-Safe system.   [name] was instructed to call 911 with any severe reactions post vaccine:  Difficulty breathing   Swelling of face and throat   A fast heartbeat   A bad rash all over body   Dizziness and weakness   Immunizations Administered    Name Date Dose VIS Date Route   Pfizer COVID-19 Vaccine 09/17/2019  3:59 PM 0.3 mL 05/22/2018 Intramuscular   Manufacturer: [place]   Lot: [id number]   [place]: [id number]

## 2019-10-08 ENCOUNTER — Ambulatory Visit: Payer: Medicaid Other

## 2019-10-15 ENCOUNTER — Ambulatory Visit: Payer: Medicaid Other

## 2019-10-22 ENCOUNTER — Ambulatory Visit: Payer: Medicaid Other | Attending: Internal Medicine

## 2019-10-22 DIAGNOSIS — Z23 Encounter for immunization: Secondary | ICD-10-CM

## 2019-10-22 NOTE — Progress Notes (Signed)
   Covid-19 Vaccination Clinic  Name:  TYREKA HENNEKE    MRN: [id number] DOB: 07-30-1965  10/22/2019  Ms. Miltenberger was observed post Covid-19 immunization for 15 minutes without incident. She was provided with Vaccine Information Sheet and instruction to access the V-Safe system.   Ms. Tailor was instructed to call 911 with any severe reactions post vaccine: Marland Kitchen Difficulty breathing  . Swelling of face and throat  . A fast heartbeat  . A bad rash all over body  . Dizziness and weakness   Immunizations Administered    Name Date Dose VIS Date Route   Pfizer COVID-19 Vaccine 10/22/2019  9:07 AM 0.3 mL 05/22/2018 Intramuscular   Manufacturer: [place]   Lot: [id number]   [place]: [id number]

## 2019-10-24 ENCOUNTER — Telehealth: Payer: Self-pay | Admitting: Surgery

## 2019-10-24 NOTE — Telephone Encounter (Signed)
Incoming call from patient.  She was informed that she needs to wait at least 6 weeks or better after receiving her 2nd Covid vaccine before having her imaging done.  Therefore, she wants all of her imaging to be rescheduled to 12/09/19 or sometime that week.  She will also need to reschedule her appointment with Dr. Hampton Abbot.  I offered to reschedule appointment with the doctor, but she wanted to wait until she gets new dates for her imaging.  Please call her when rescheduled.  Thank you.

## 2019-10-24 NOTE — Telephone Encounter (Signed)
Mammogram and office visit rescheduled. The patient is aware.

## 2019-11-18 ENCOUNTER — Other Ambulatory Visit: Payer: Medicaid Other

## 2019-11-25 ENCOUNTER — Ambulatory Visit: Payer: Medicaid Other | Admitting: Surgery

## 2019-12-11 ENCOUNTER — Ambulatory Visit
Admission: RE | Admit: 2019-12-11 | Discharge: 2019-12-11 | Disposition: A | Discharge status: Home / Self Care | Payer: Medicaid Other | Source: Ambulatory Visit | Attending: Surgery | Admitting: Surgery

## 2019-12-11 ENCOUNTER — Other Ambulatory Visit: Payer: Self-pay

## 2019-12-11 DIAGNOSIS — D0512 Intraductal carcinoma in situ of left breast: Secondary | ICD-10-CM | POA: Diagnosis not present

## 2019-12-18 ENCOUNTER — Other Ambulatory Visit: Payer: Self-pay

## 2019-12-18 ENCOUNTER — Ambulatory Visit (INDEPENDENT_AMBULATORY_CARE_PROVIDER_SITE_OTHER): Payer: Medicaid Other | Admitting: Surgery

## 2019-12-18 ENCOUNTER — Encounter: Payer: Self-pay | Admitting: Surgery

## 2019-12-18 VITALS — BP 163/98 | HR 90 | Temp 98.7°F | Resp 12 | Ht 62.0 in | Wt 315.0 lb

## 2019-12-18 DIAGNOSIS — D0512 Intraductal carcinoma in situ of left breast: Secondary | ICD-10-CM

## 2019-12-18 DIAGNOSIS — N62 Hypertrophy of breast: Secondary | ICD-10-CM

## 2019-12-18 NOTE — Progress Notes (Signed)
12/18/2019  History of Present Illness: Felicia Ford is a 54 y.o. female with history of left breast DCIS s/p lumpectomy in 08/2014 with Dr. Bary Castilla.  She presents today for follow up.  Her most recent mammogram was done on 12/11/19 which showed stable surgical changes, but negative for any suspicious findings.   Today, she reports that she's been doing well, and still has intermittent burning at the left breast scar site.  However, her main complaint today is more related to the size of her breasts.  She reports having a lot of back pain and shoulder pain because of the heaviness of her breasts.  She had to wear two bra's to get some relief.  She reports that because of the pain, it's harder to do any exercise.  Past Medical History: Past Medical History:  Diagnosis Date   Anemia    Breast cancer ([place]) 08/26/2014   Left breast DCIS, ER positive, PR positive. With mammosite rad tx   Elevated blood pressure    PT GOING [facility]14 FOR RECHECK PF BP TO DETERMINE IT BP MEDS NEED TO BE STARTED   Personal history of radiation therapy 10/07/2014   mammosite   Sleep apnea      Past Surgical History: Past Surgical History:  Procedure Laterality Date   BREAST BIOPSY Left 08/26/2014   DCIS   BREAST BIOPSY Left 09/18/2014   Procedure: BREAST BIOPSY WITH NEEDLE LOCALIZATION;  Surgeon: Robert Bellow, MD;  Location: [facility] OR[facility];  Service: General;  Laterality: Left;   BREAST LUMPECTOMY Left 09/18/2014   DCIS with clear margins   BREAST MAMMOSITE Left 10/07/2014   Procedure: MAMMOSITE BREAST;  Surgeon: Robert Bellow, MD;  Location: [facility];  Service: General;  Laterality: Left;   COLONOSCOPY WITH PROPOFOL N/A 09/21/2016   Procedure: COLONOSCOPY WITH PROPOFOL;  Surgeon: Robert Bellow, MD;  Location: [facility];  Service: Endoscopy;  Laterality: N/A;    Home Medications: Prior to Admission medications   Medication Sig Start Date End Date Taking? Authorizing  Provider  amLODipine (NORVASC) 5 MG tablet TK 1 T PO QD 07/29/18  Yes [provider]  aspirin 81 MG tablet Take 81 mg by mouth daily.   Yes [provider]  tamoxifen (NOLVADEX) 20 MG tablet TAKE 1 TABLET(20 MG) BY MOUTH DAILY 07/29/18  Yes Byrnett, Forest Gleason, MD    Allergies: Allergies  Allergen Reactions   Latex     Review of Systems: Review of Systems  Constitutional: Negative for chills and fever.  Respiratory: Negative for shortness of breath.   Cardiovascular: Negative for chest pain.  Gastrointestinal: Negative for nausea and vomiting.  Skin: Negative for rash.    Physical Exam BP (!) 163/98    Pulse 90    Temp 98.7 F (37.1 C) (Oral)    Resp 12    Ht 5\' 2"  (1.575 m)    Wt (!) 315 lb (142.9 kg)    SpO2 99%    BMI 57.61 kg/m  CONSTITUTIONAL: No acute distress HEENT:  Normocephalic, atraumatic, extraocular motion intact. RESPIRATORY:  Lungs are clear, and breath sounds are equal bilaterally. Normal respiratory effort without pathologic use of accessory muscles. CARDIOVASCULAR: Heart is regular without murmurs, gallops, or rubs. BREAST:  Bilateral large breasts.  Left breast with upper outer quadrant lumpectomy site well healed.  No palpable masses, skin changes, or nipple changes.  No left axillary or supraclavicular lymphadenopathy.  Right breast without any palpable masses, skin changes, or nipple changes.  No right axillary or supraclavicular lymphadenopathy. NEUROLOGIC:  Motor and sensation is grossly normal.  Cranial nerves are grossly intact. PSYCH:  Alert and oriented to person, place and time. Affect is normal.  Labs/Imaging: Mammogram 12/11/19 FINDINGS: There is density and architectural distortion within the LEFT breast, consistent with postsurgical changes. These are stable in comparison to prior. No suspicious mass, distortion, or microcalcifications are identified to suggest presence of malignancy.  Mammographic images were processed with  CAD.  IMPRESSION: No mammographic evidence of malignancy.  RECOMMENDATION: Screening mammogram in one year.(Code:SM-B-01Y)  BI-RADS CATEGORY  2: Benign.  Assessment and Plan: This is a 54 y.o. female s/p left breast lumpectomy for DCIS in 08/2014  --Exam and mammogram overall reassuring without any suspicious findings.  She's now 5 years out from surgery.  Discussed with her that once her current prescription for Tamoxifen runs out, she can stop the medication.  Also discussed that going forwards, she can follow up with her PCP for yearly screening mammograms as she was doing prior to surgery.  We're always available if there are any concerns from her standpoint or any findings on her mammogram. --We will also send referral to Plastic Surgery for evaluation for reduction mammaplasty. --Follow up with [place] as needed.  Face-to-face time spent with the patient and care providers was 15 minutes, with more than 50% of the time spent counseling, educating, and coordinating care of the patient.     Melvyn Neth, [facility]

## 2019-12-18 NOTE — Patient Instructions (Addendum)
We will refer you to Plastic surgery to speak about breast reduction surgery. They will contact you to schedule this appointment.   You are due for bilateral screening mammogram in one year. You may have this done through your primary care provider.   Continue self breast exams, do these at the same time every month. Call office for any new breast issues or concerns.

## 2020-01-17 ENCOUNTER — Other Ambulatory Visit: Payer: Self-pay

## 2020-01-30 ENCOUNTER — Encounter: Payer: Self-pay | Admitting: Plastic Surgery

## 2020-01-30 ENCOUNTER — Ambulatory Visit (INDEPENDENT_AMBULATORY_CARE_PROVIDER_SITE_OTHER): Payer: Medicaid Other | Admitting: Plastic Surgery

## 2020-01-30 ENCOUNTER — Other Ambulatory Visit: Payer: Self-pay

## 2020-01-30 VITALS — HR 74 | Temp 97.7°F | Ht 62.0 in | Wt 313.0 lb

## 2020-01-30 DIAGNOSIS — M546 Pain in thoracic spine: Secondary | ICD-10-CM | POA: Diagnosis not present

## 2020-01-30 DIAGNOSIS — M4004 Postural kyphosis, thoracic region: Secondary | ICD-10-CM | POA: Diagnosis not present

## 2020-01-30 DIAGNOSIS — M545 Low back pain, unspecified: Secondary | ICD-10-CM | POA: Diagnosis not present

## 2020-01-30 DIAGNOSIS — N62 Hypertrophy of breast: Secondary | ICD-10-CM | POA: Diagnosis not present

## 2020-01-30 NOTE — Progress Notes (Signed)
Referring Provider [address], [place],  [place] [address]   CC:  Chief Complaint  Patient presents with  . Consult      Felicia Ford is an 54 y.o. female.  HPI: Patient presents to discuss breast reduction.  She had years of back pain, neck pain and shoulder grooving related to her large breast.  She does not smoke and is a prediabetic but is unsure of her hemoglobin A1c.  She has had a left lumpectomy and radiation before which was back in 2016.  Recent mammograms have been fine.  She is tried over-the-counter medications, warm packs, cold packs and supportive garments for her back pain with little relief.  She also gets rashes beneath the crease that have been refractory to over-the-counter medications.  She is interested in surgical treatment.  Allergies  Allergen Reactions  . Latex     Outpatient Encounter Medications as of 01/30/2020  Medication Sig  . amLODipine (NORVASC) 5 MG tablet TK 1 T PO QD  . aspirin 81 MG tablet Take 81 mg by mouth daily.  . metformin (FORTAMET) 500 MG (OSM) 24 hr tablet Take 500 mg by mouth daily with breakfast.  . tamoxifen (NOLVADEX) 20 MG tablet TAKE 1 TABLET(20 MG) BY MOUTH DAILY   No facility-administered encounter medications on file as of 01/30/2020.     Past Medical History:  Diagnosis Date  . Anemia   . Breast cancer ([place]) 08/26/2014   Left breast DCIS, ER positive, PR positive. With mammosite rad tx  . Elevated blood pressure    [facility]14 FOR RECHECK PF BP TO DETERMINE IT BP MEDS NEED TO BE STARTED  . Personal history of radiation therapy 10/07/2014   mammosite  . Sleep apnea     Past Surgical History:  Procedure Laterality Date  . BREAST BIOPSY Left 08/26/2014   DCIS  . BREAST BIOPSY Left 09/18/2014   Procedure: BREAST BIOPSY WITH NEEDLE LOCALIZATION;  Surgeon: Robert Bellow, MD;  Location: [facility];  Service: General;  Laterality: Left;  . BREAST LUMPECTOMY Left 09/18/2014    DCIS with clear margins  . BREAST MAMMOSITE Left 10/07/2014   Procedure: MAMMOSITE BREAST;  Surgeon: Robert Bellow, MD;  Location: [facility];  Service: General;  Laterality: Left;  . COLONOSCOPY WITH PROPOFOL N/A 09/21/2016   Procedure: COLONOSCOPY WITH PROPOFOL;  Surgeon: Robert Bellow, MD;  Location: [facility];  Service: Endoscopy;  Laterality: N/A;    Family History  Problem Relation Age of Onset  . Diabetes Mother   . Lupus Sister   . Lung cancer Maternal Uncle   . Breast cancer Neg Hx     Social History   Social History Narrative  . Not on file  Denies tobacco use  Review of Systems General: Denies fevers, chills, weight loss CV: Denies chest pain, shortness of breath, palpitations  Physical Exam Vitals with BMI 01/30/2020 12/18/2019 12/11/2018  Height 5\' 2"  5\' 2"  5\' 2"   Weight 313 lbs 315 lbs 306 lbs 10 oz  BMI 57.23 60.4 54.09  Systolic - 811 914  Diastolic - 98 85  Pulse 74 90 76    General:  No acute distress,  Alert and oriented, Non-Toxic, Normal speech and affect Breast: She has grade 3 ptosis.  Sternal notch to nipple is 41 cm in the right and 39 cm on the left.  Nipple to fold is 20 cm bilaterally.  She has an upper outer quadrant scar  on the left from her lumpectomy.  I do not see any obvious other scars or masses.  Assessment/Plan I had long discussion with the patient about breast reduction surgery.  In my opinion she would not be a candidate for pedicle reduction but might be a suitable candidate for free nipple graft.  I did discuss the additional risk of her current BMI of 57.  The patient is interested in losing weight and has considered weight loss surgery in the past.  I discussed the high rate of wound healing complications and subcutaneous fluid collections with breast reduction surgery in a patient with a BMI as high as hers.  I think her risk would be quite a bit better if she could get down below 50.  Additionally if she had weight loss  surgery after reduction the appearance of her breast would be quite deflated so if possible she should have that done before reduction to have the best long-lasting result.  She is going to think about these options and come back to see me in 6 months.  All of her questions were answered.  Cindra Presume 01/30/2020, 11:15 AM

## 2020-07-29 ENCOUNTER — Ambulatory Visit: Payer: Medicaid Other | Admitting: Plastic Surgery

## 2020-07-31 ENCOUNTER — Encounter: Payer: Self-pay | Admitting: Obstetrics and Gynecology

## 2020-08-03 ENCOUNTER — Encounter: Payer: Self-pay | Admitting: Obstetrics and Gynecology

## 2020-08-06 ENCOUNTER — Encounter: Payer: Medicaid Other | Admitting: Obstetrics and Gynecology

## 2020-08-31 ENCOUNTER — Other Ambulatory Visit: Payer: Medicaid Other

## 2020-09-01 ENCOUNTER — Other Ambulatory Visit: Payer: Medicaid Other

## 2020-09-04 ENCOUNTER — Ambulatory Visit (LOCAL_COMMUNITY_HEALTH_CENTER): Payer: Medicaid Other

## 2020-09-04 ENCOUNTER — Other Ambulatory Visit: Payer: Self-pay

## 2020-09-04 DIAGNOSIS — Z111 Encounter for screening for respiratory tuberculosis: Secondary | ICD-10-CM

## 2020-09-07 ENCOUNTER — Ambulatory Visit (LOCAL_COMMUNITY_HEALTH_CENTER): Payer: Medicaid Other

## 2020-09-07 ENCOUNTER — Other Ambulatory Visit: Payer: Self-pay

## 2020-09-07 DIAGNOSIS — Z111 Encounter for screening for respiratory tuberculosis: Secondary | ICD-10-CM

## 2020-09-07 LAB — TB SKIN TEST
Induration: 0 mm
TB Skin Test: NEGATIVE

## 2020-10-27 ENCOUNTER — Encounter: Payer: Medicaid Other | Admitting: Obstetrics and Gynecology

## 2020-10-28 ENCOUNTER — Encounter: Payer: Self-pay | Admitting: Obstetrics and Gynecology

## 2020-11-25 ENCOUNTER — Other Ambulatory Visit: Payer: Self-pay | Admitting: Family Medicine

## 2020-12-01 ENCOUNTER — Other Ambulatory Visit: Payer: Self-pay | Admitting: Family Medicine

## 2020-12-01 ENCOUNTER — Other Ambulatory Visit: Payer: Self-pay | Admitting: Surgery

## 2020-12-01 DIAGNOSIS — Z1231 Encounter for screening mammogram for malignant neoplasm of breast: Secondary | ICD-10-CM

## 2021-01-04 ENCOUNTER — Other Ambulatory Visit: Payer: Self-pay

## 2021-01-04 ENCOUNTER — Ambulatory Visit
Admission: RE | Admit: 2021-01-04 | Discharge: 2021-01-04 | Disposition: A | Discharge status: Home / Self Care | Payer: Medicaid Other | Source: Ambulatory Visit | Attending: Family Medicine | Admitting: Family Medicine

## 2021-01-04 DIAGNOSIS — Z1231 Encounter for screening mammogram for malignant neoplasm of breast: Secondary | ICD-10-CM | POA: Diagnosis not present

## 2021-01-06 ENCOUNTER — Other Ambulatory Visit
Admission: RE | Admit: 2021-01-06 | Discharge: 2021-01-06 | Disposition: A | Discharge status: Home / Self Care | Payer: Medicaid Other | Attending: Physician Assistant | Admitting: Physician Assistant

## 2021-05-28 ENCOUNTER — Encounter: Payer: Medicaid Other | Admitting: Obstetrics and Gynecology

## 2021-06-14 ENCOUNTER — Ambulatory Visit (INDEPENDENT_AMBULATORY_CARE_PROVIDER_SITE_OTHER): Payer: Medicaid Other | Admitting: Obstetrics and Gynecology

## 2021-06-14 ENCOUNTER — Encounter: Payer: Self-pay | Admitting: Obstetrics and Gynecology

## 2021-06-14 ENCOUNTER — Other Ambulatory Visit (HOSPITAL_COMMUNITY)
Admission: RE | Admit: 2021-06-14 | Discharge: 2021-06-14 | Disposition: A | Discharge status: Home / Self Care | Payer: Medicaid Other | Source: Ambulatory Visit | Attending: Obstetrics and Gynecology | Admitting: Obstetrics and Gynecology

## 2021-06-14 ENCOUNTER — Other Ambulatory Visit: Payer: Self-pay

## 2021-06-14 VITALS — BP 137/88 | HR 73 | Ht 62.0 in | Wt 321.5 lb

## 2021-06-14 DIAGNOSIS — Z01419 Encounter for gynecological examination (general) (routine) without abnormal findings: Secondary | ICD-10-CM | POA: Diagnosis present

## 2021-06-14 DIAGNOSIS — Z124 Encounter for screening for malignant neoplasm of cervix: Secondary | ICD-10-CM | POA: Insufficient documentation

## 2021-06-14 DIAGNOSIS — Z7689 Persons encountering health services in other specified circumstances: Secondary | ICD-10-CM | POA: Diagnosis not present

## 2021-06-14 DIAGNOSIS — N951 Menopausal and female climacteric states: Secondary | ICD-10-CM

## 2021-06-14 NOTE — Progress Notes (Signed)
HPI: ?     Felicia Ford is a 56 y.o. [phone] who LMP was Patient's last menstrual period was 05/31/2021 (exact date). ? ?Subjective:  ? ?She presents today for her annual examination.  She states that she is due for a Pap smear but not a mammogram. ?She has recently begun having irregular cycles-about 4 to 5 months ago-she began skipping.  And that her last menses was 2 weeks long and very heavy.  She says this was quite disruptive to her life.  She was told by her other doctor that she is likely perimenopausal.  She is very interested in regulation of her cycle if she is premenopausal so that she does not have irregular and very heavy menses as she has over the last few months. ?She is currently not sexually active and is using nothing for birth control. ?Of significant note, patient has a history of breast cancer and has just finished a 5-year course of tamoxifen. ? ?  Hx: ?The following portions of the patient's history were reviewed and updated as appropriate: ?            She  has a past medical history of Anemia, Breast cancer ([place]) (08/26/2014), Elevated blood pressure, Personal history of radiation therapy (10/07/2014), and Sleep apnea. ?She does not have any pertinent problems on file. ?She  has a past surgical history that includes Breast Mammosite (Left, 10/07/2014); Colonoscopy with propofol (N/A, 09/21/2016); Breast biopsy (Left, 08/26/2014); Breast biopsy (Left, 09/18/2014); and Breast lumpectomy (Left, 09/18/2014). ?Her family history includes Diabetes in her mother; Lung cancer in her maternal uncle; Lupus in her sister. ?She  reports that she has never smoked. She has never used smokeless tobacco. She reports that she does not drink alcohol and does not use drugs. ?She has a current medication list which includes the following prescription(s): amlodipine, dulaglutide, losartan, and pravastatin sodium. ?She is allergic to latex. ?      ?Review of Systems:  ?Review of Systems ? ?Constitutional:  Denied constitutional symptoms, night sweats, recent illness, fatigue, fever, insomnia and weight loss.  ?Eyes: Denied eye symptoms, eye pain, photophobia, vision change and visual disturbance.  ?Ears/Nose/Throat/Neck: Denied ear, nose, throat or neck symptoms, hearing loss, nasal discharge, sinus congestion and sore throat.  ?Cardiovascular: Denied cardiovascular symptoms, arrhythmia, chest pain/pressure, edema, exercise intolerance, orthopnea and palpitations.  ?Respiratory: Denied pulmonary symptoms, asthma, pleuritic pain, productive sputum, cough, dyspnea and wheezing.  ?Gastrointestinal: Denied, gastro-esophageal reflux, melena, nausea and vomiting.  ?Genitourinary: See HPI for additional information.  ?Musculoskeletal: Denied musculoskeletal symptoms, stiffness, swelling, muscle weakness and myalgia.  ?Dermatologic: Denied dermatology symptoms, rash and scar.  ?Neurologic: Denied neurology symptoms, dizziness, headache, neck pain and syncope.  ?Psychiatric: Denied psychiatric symptoms, anxiety and depression.  ?Endocrine: Denied endocrine symptoms including hot flashes and night sweats.  ? ?Meds: ?  ?Current Outpatient Medications on File Prior to Visit  ?Medication Sig Dispense Refill  ? amLODipine (NORVASC) 5 MG tablet TK 1 T PO QD    ? Dulaglutide (TRULICITY [place]) Inject into the skin once a week.    ? losartan (COZAAR) 50 MG tablet Take 50 mg by mouth daily.    ? PRAVASTATIN SODIUM PO Take by mouth.    ? ?No current facility-administered medications on file prior to visit.  ? ? ? ?Objective:  ?  ? ?Vitals:  ? 06/14/21 0810  ?BP: 137/88  ?Pulse: 73  ?  ?Filed Weights  ? 06/14/21 0810  ?Weight: (!) 321 lb 8 oz (145.8  kg)  ? ?  ?         Physical examination ?General NAD, Conversant  ?HEENT Atraumatic; Op clear with mmm.  Normo-cephalic. Pupils reactive. Anicteric sclerae  ?Thyroid/Neck Smooth without nodularity or enlargement. Normal ROM.  Neck Supple.  ?Skin No rashes, lesions or ulceration. Normal palpated  skin turgor. No nodularity.  ?Breasts: No masses or discharge.  Symmetric.  No axillary adenopathy.  ?Lungs: Clear to auscultation.No rales or wheezes. Normal Respiratory effort, no retractions.  ?Heart: NSR.  No murmurs or rubs appreciated. No periferal edema  ?Abdomen: Soft.  Non-tender.  No masses.  No HSM. No hernia  ?Extremities: Moves all appropriately.  Normal ROM for age. No lymphadenopathy.  ?Neuro: Oriented to PPT.  Normal mood. Normal affect.  ? ?  Pelvic:   ?Vulva: Normal appearance.  No lesions.  ?Vagina: No lesions or abnormalities noted.  ?Support: Normal pelvic support.  ?Urethra No masses tenderness or scarring.  ?Meatus Normal size without lesions or prolapse.  ?Cervix: Normal appearance.  No lesions.  ?Anus: Normal exam.  No lesions.  ?Perineum: Normal exam.  No lesions.  ?      Bimanual   ?Uterus: Normal size.  Non-tender.  Mobile.  AV.  ?Adnexae: No masses.  Non-tender to palpation.  ?Cul-de-sac: Negative for abnormality.  ? ?Exam limited by body habitus ? ? ?Assessment:  ?  ?[id number] ?Patient Active Problem List  ? Diagnosis Date Noted  ? Encounter for screening colonoscopy 08/10/2016  ? Menorrhagia with regular cycle 03/31/2015  ? Use of tamoxifen (Nolvadex) 03/31/2015  ? Obesity, morbid, BMI 50 or higher ([place]) 03/31/2015  ? Perimenopausal vasomotor symptoms 01/21/2015  ? DCIS (ductal carcinoma in situ) 12/02/2014  ? ?  ?1. Well woman exam with routine gynecological exam   ?2. Cervical cancer screening   ?3. Encounter to establish care   ?4. Symptomatic menopausal or female climacteric states   ? ? Patient having irregular cycles.  Possibly perimenopausal.  We must first determine if she is menopausal versus premenopausal.  The work-up and treatment will be different. ? ? ? ?Plan:  ?  ?       ? 1.  Basic Screening Recommendations ?The basic screening recommendations for asymptomatic women were discussed with the patient during her visit.  The age-appropriate recommendations were discussed with  her and the rational for the tests reviewed.  When I am informed by the patient that another primary care physician has previously obtained the age-appropriate tests and they are up-to-date, only outstanding tests are ordered and referrals given as necessary.  Abnormal results of tests will be discussed with her when all of her results are completed.  Routine preventative health maintenance measures emphasized: Exercise/Diet/Weight control, Tobacco Warnings, Alcohol/Substance use risks and Stress Management ?Pap performed -patient declined labs -she says she is up-to-date on mammography. ?2.  Recommend FSH ? Consider cycle regulation if patient premenopausal.  If postmenopausal consider ultrasound/endometrial biopsy. ? ?Orders ?Orders Placed This Encounter  ?Procedures  ? Follicle stimulating hormone  ? ? No orders of the defined types were placed in this encounter. ?    ?  ?  F/U ? Return in about 1 year (around 06/15/2022) for We will contact her with any abnormal test results, Annual Physical. ? ?Finis Bud, M.D. ?06/14/2021 ?8:44 AM ? ? ? ?

## 2021-06-14 NOTE — Progress Notes (Signed)
Patients presents for annual exam today. Patient states having heavy irregular cycles for the past 4-5 months.  ?Patient is due for pap smear, ordered. Patient states no other questions or concerns at this time.   ?

## 2021-06-15 LAB — CYTOLOGY - PAP
Comment: NEGATIVE
Diagnosis: NEGATIVE
High risk HPV: NEGATIVE

## 2021-06-24 ENCOUNTER — Encounter (HOSPITAL_COMMUNITY): Payer: Medicaid Other

## 2021-12-07 ENCOUNTER — Telehealth: Payer: Self-pay

## 2021-12-07 NOTE — Telephone Encounter (Signed)
Advising via [employer]

## 2021-12-07 NOTE — Telephone Encounter (Signed)
This pt is experiencing excessive bleeding and is wanting an appt the next available is 9/22 she wants to know what she can do in the meantime

## 2021-12-20 ENCOUNTER — Other Ambulatory Visit: Payer: Self-pay | Admitting: Family Medicine

## 2021-12-20 DIAGNOSIS — Z1231 Encounter for screening mammogram for malignant neoplasm of breast: Secondary | ICD-10-CM

## 2022-03-04 IMAGING — MG MM DIGITAL SCREENING BILAT W/ TOMO AND CAD
8 of 17 series · 8 of 40 positions shown · non-contrast
Comparison: Previous exam(s).

ACR Breast Density Category a: The breast tissue is almost entirely
fatty.

CLINICAL DATA: Screening.

EXAM:
DIGITAL SCREENING BILATERAL MAMMOGRAM WITH TOMOSYNTHESIS AND CAD
TECHNIQUE: Bilateral screening digital craniocaudal and mediolateral oblique
mammograms were obtained. Bilateral screening digital breast
tomosynthesis was performed. The images were evaluated with
computer-aided detection.

[L CC synth-2D]
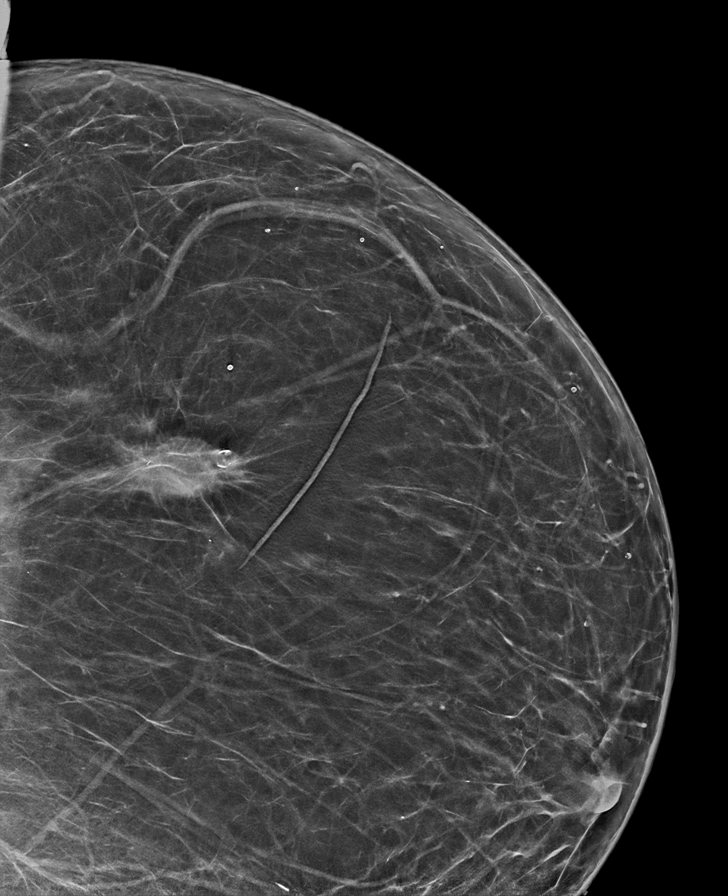

[R CC synth-2D (1 of 2)]
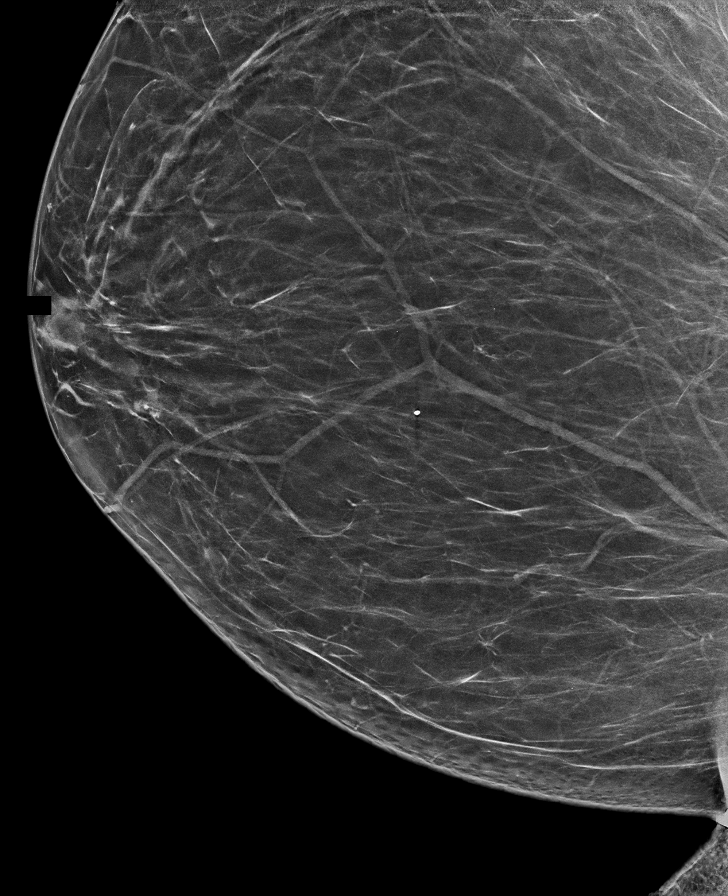

[L MLO synth-2D (1 of 2)]
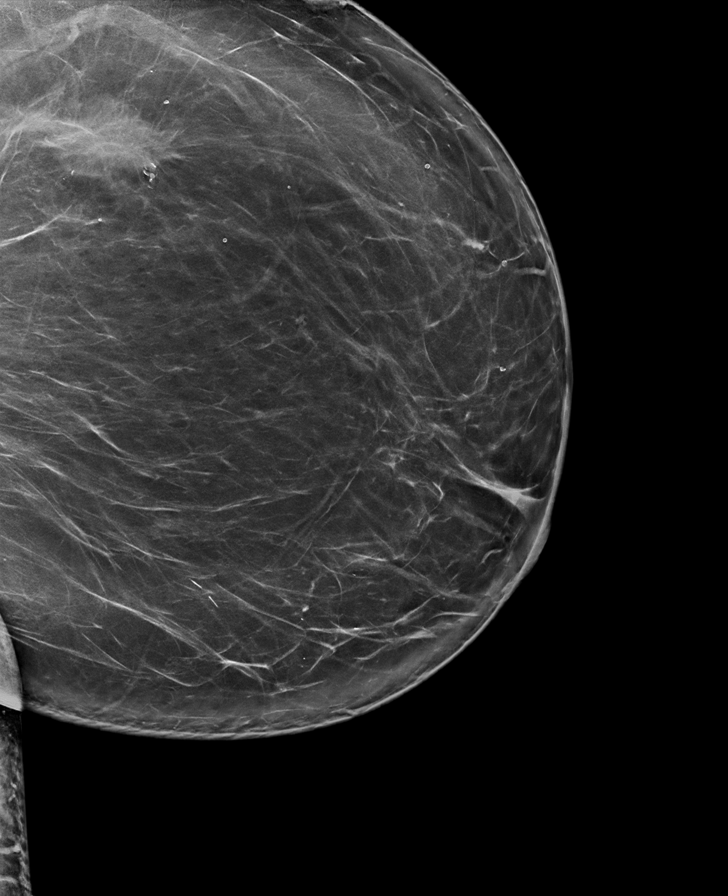

[R MLO synth-2D (1 of 2)]
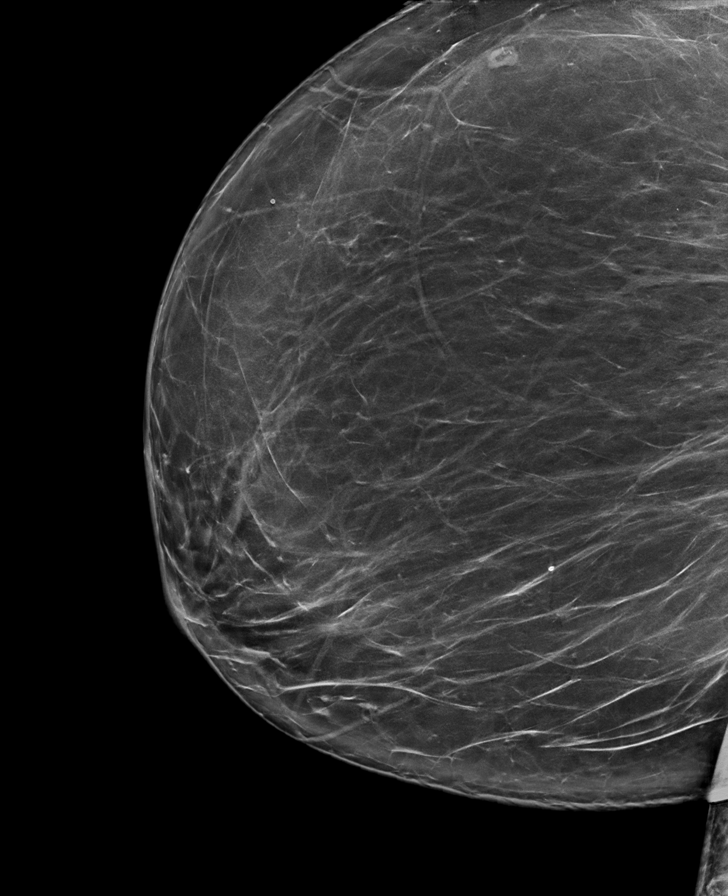

[R MLO synth-2D (2 of 2)]
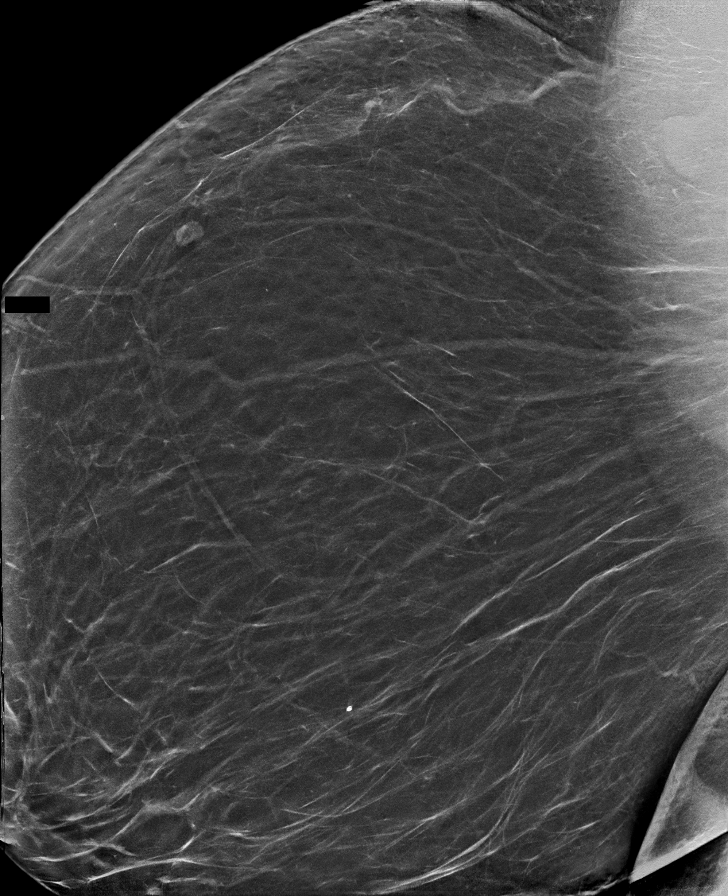

[L MLO synth-2D (2 of 2)]
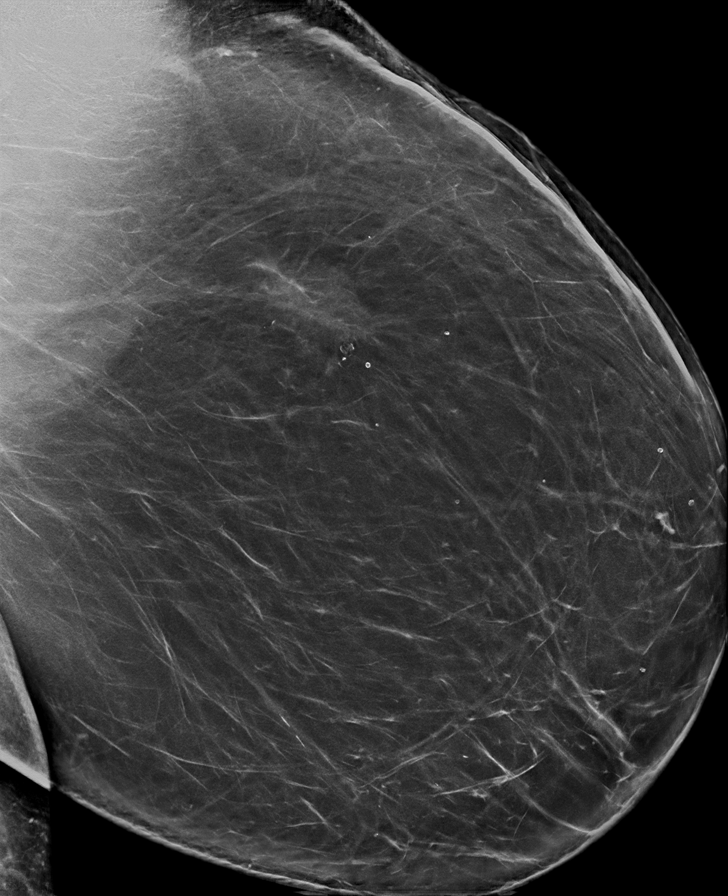

[R CC synth-2D (2 of 2)]
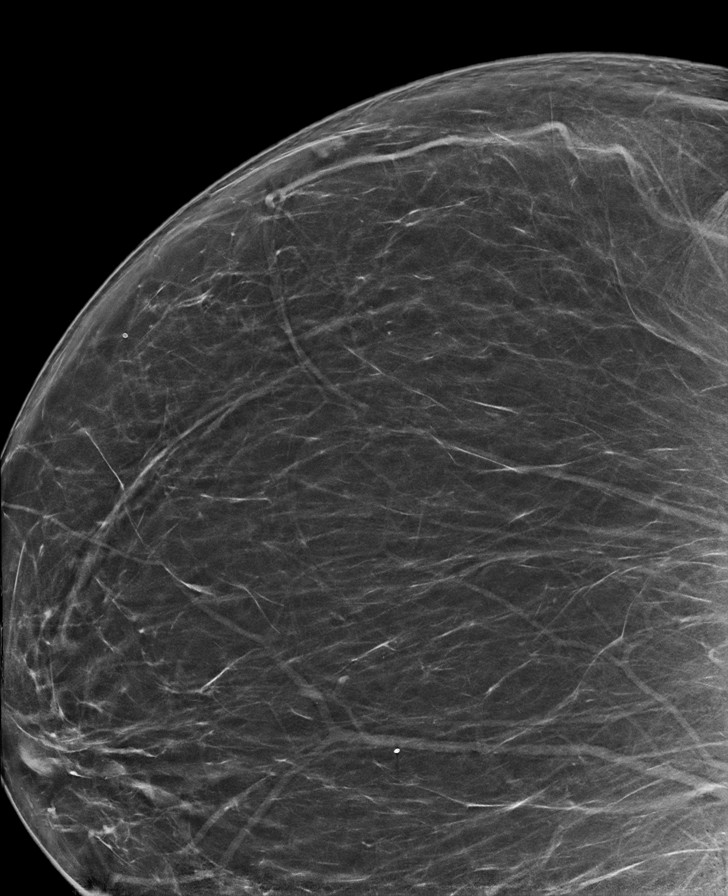

[L MLO tomo · tomo slice 55/109.0]
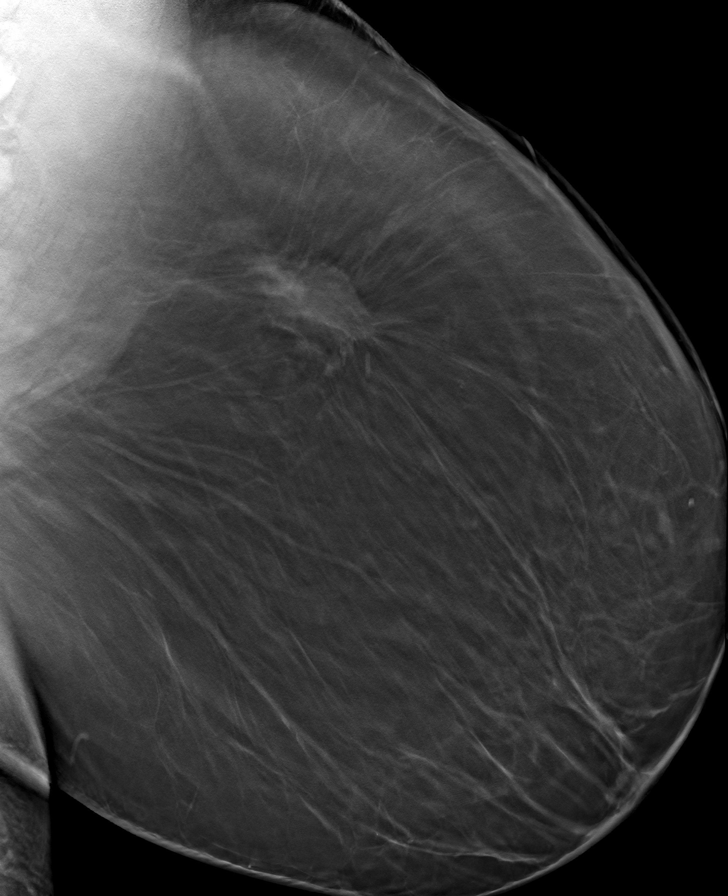

[8 of 40 positions shown; findings below may reference images not displayed]

FINDINGS: There are no findings suspicious for malignancy.
IMPRESSION: No mammographic evidence of malignancy. A result letter of this
screening mammogram will be mailed directly to the patient.

RECOMMENDATION:
Screening mammogram in one year. (Code:0E-3-N98)

BI-RADS CATEGORY  1: Negative.

## 2022-04-19 ENCOUNTER — Ambulatory Visit
Admission: RE | Admit: 2022-04-19 | Discharge: 2022-04-19 | Disposition: A | Discharge status: Home / Self Care | Payer: Medicaid Other | Source: Ambulatory Visit | Attending: Family Medicine | Admitting: Family Medicine

## 2022-04-19 DIAGNOSIS — Z1231 Encounter for screening mammogram for malignant neoplasm of breast: Secondary | ICD-10-CM | POA: Diagnosis not present

## 2022-06-16 ENCOUNTER — Encounter: Payer: Self-pay | Admitting: Obstetrics and Gynecology

## 2022-06-16 ENCOUNTER — Ambulatory Visit: Payer: Medicaid Other | Admitting: Obstetrics and Gynecology

## 2022-06-16 DIAGNOSIS — Z01419 Encounter for gynecological examination (general) (routine) without abnormal findings: Secondary | ICD-10-CM

## 2022-07-07 ENCOUNTER — Ambulatory Visit: Payer: Medicaid Other | Admitting: Obstetrics & Gynecology

## 2022-08-14 NOTE — Progress Notes (Signed)
PCP: Pediatrics, [facility]   Chief Complaint  Patient presents with   Gynecologic Exam    No concerns    HPI:      [name] is a 57 y.o. [id number] whose LMP was Patient's last menstrual period was 03/07/2022 (approximate)., presents today for her annual examination.  Her menses are infrequent due to perimenopause, Q4-6 months, lasting 7 days or a little longer, mod to heavy flow, with nickel sized clots, no BTB, mild dysmen, no meds needed. She does have vasomotor sx.   Sex activity: not sexually active. She does not have vaginal dryness/sx.  Last Pap: 06/14/21  Results were: no abnormalities /neg HPV DNA.  No hx of abn paps with tx.   Last mammogram: 04/19/22 Results were: normal--routine follow-up in 12 months; followed by [facility] There is a FH of breast cancer in her mat cousin. There is a FH of ovarian cancer in her MGM. The patient does do self-breast exams. Pt with hx of LT breast DCIS, ER+/PR+ 2016; s/p lumpectomy, radiation and tamoxifen. No longer doing any tx. Pt states she had neg cancer genetic testing with oncology.   Colonoscopy: 6/18 with Dr. Lemar Livings; was normal per pt; Repeat due after 10 years per pt.   Tobacco use: The patient denies current or previous tobacco use. Alcohol use: none No drug use Exercise: not active; doing PT now for hurt knee playing basketball with grandkids 3/24  She does get adequate calcium and Vitamin D in her diet.  Labs with PCP.   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    Past Surgical History:  Procedure Laterality Date   BREAST BIOPSY Left 08/26/2014   DCIS   BREAST BIOPSY Left 09/18/2014    Procedure: BREAST BIOPSY WITH NEEDLE LOCALIZATION;  Surgeon: Earline Mayotte, MD;  Location: [facility] ORS;  Service: General;  Laterality: Left;   BREAST LUMPECTOMY Left 09/18/2014   DCIS with clear margins   BREAST MAMMOSITE Left 10/07/2014   Procedure: MAMMOSITE BREAST;  Surgeon: Earline Mayotte, MD;  Location: [facility] ORS;  Service: General;  Laterality: Left;   COLONOSCOPY WITH PROPOFOL N/A 09/21/2016   Procedure: COLONOSCOPY WITH PROPOFOL;  Surgeon: Earline Mayotte, MD;  Location: [facility] ENDOSCOPY;  Service: Endoscopy;  Laterality: N/A;    Family History  Problem Relation Age of Onset   Diabetes Mother    Lupus Sister    Lung cancer Maternal Uncle    Ovarian cancer Maternal Grandmother        52s   Breast cancer Cousin        48s    Social History   Socioeconomic History   Marital status: Single    Spouse name: Not on file   Number of children: Not on file   Years of education: Not on file   Highest education level: Not on file  Occupational History   Not on file  Tobacco Use   Smoking status: Never   Smokeless tobacco: Never  Vaping Use   Vaping Use: Never used  Substance and Sexual Activity   Alcohol use: No    Alcohol/week: 0.0 standard drinks of alcohol   Drug use: No   Sexual activity:  Not Currently    Birth control/protection: None  Other Topics Concern   Not on file  Social History Narrative   Not on file   Social Determinants of Health   Financial Resource Strain: Not on file  Food Insecurity: Not on file  Transportation Needs: Not on file  Physical Activity: Not on file  Stress: Not on file  Social Connections: Not on file  Intimate Partner Violence: Not on file     Current Outpatient Medications:    amLODipine (NORVASC) 5 MG tablet, TK 1 T PO QD, Disp: , Rfl:    Blood Glucose Monitoring Suppl (ACCU-CHEK GUIDE ME) w/Device KIT, USE TO CHECK BLOOD SUGAR IN THE MORNINGS, Disp: , Rfl:    ferrous sulfate 325 (65 FE) MG EC tablet, TAKE 1 TABLET BY  MOUTH EVERY MORNING AND 1 TABLET EVERY EVENING WITH MEALS, Disp: , Rfl:    glucose blood (ACCU-CHEK GUIDE) test strip, USE TO CHECK BLOOD SUGAR ONCE A DAY, Disp: , Rfl:    hydrochlorothiazide (HYDRODIURIL) 25 MG tablet, Take 1 tablet by mouth daily., Disp: , Rfl:    losartan (COZAAR) 50 MG tablet, Take 50 mg by mouth daily., Disp: , Rfl:    meloxicam (MOBIC) 7.5 MG tablet, Take 7.5 mg by mouth 2 (two) times daily., Disp: , Rfl:    pravastatin (PRAVACHOL) 20 MG tablet, Take 20 mg by mouth daily., Disp: , Rfl:    TRULICITY 3 MG/0.5ML SOPN, Inject into the skin. (Patient not taking: Reported on 08/15/2022), Disp: , Rfl:      ROS:  Review of Systems  Constitutional:  Negative for fatigue, fever and unexpected weight change.  Respiratory:  Negative for cough, shortness of breath and wheezing.   Cardiovascular:  Negative for chest pain, palpitations and leg swelling.  Gastrointestinal:  Negative for blood in stool, constipation, diarrhea, nausea and vomiting.  Endocrine: Negative for cold intolerance, heat intolerance and polyuria.  Genitourinary:  Negative for dyspareunia, dysuria, flank pain, frequency, genital sores, hematuria, menstrual problem, pelvic pain, urgency, vaginal bleeding, vaginal discharge and vaginal pain.  Musculoskeletal:  Negative for back pain, joint swelling and myalgias.  Skin:  Negative for rash.  Neurological:  Negative for dizziness, syncope, light-headedness, numbness and headaches.  Hematological:  Negative for adenopathy.  Psychiatric/Behavioral:  Negative for agitation, confusion, sleep disturbance and suicidal ideas. The patient is not nervous/anxious.    BREAST: No symptoms    Objective: BP (!) 140/80   Pulse 92   Ht 5\' 2"  (1.575 m)   Wt (!) 324 lb (147 kg)   LMP 03/07/2022 (Approximate)   BMI 59.26 kg/m    Physical Exam Constitutional:      Appearance: She is well-developed.  Genitourinary:     Vulva normal.     Right Labia: No rash, tenderness  or lesions.    Left Labia: No tenderness, lesions or rash.    No vaginal discharge, erythema or tenderness.      Right Adnexa: not tender and no mass present.    Left Adnexa: not tender and no mass present.    No cervical friability or polyp.     Uterus is not enlarged or tender.  Breasts:    Right: No mass, nipple discharge, skin change or tenderness.     Left: No mass, nipple discharge, skin change or tenderness.  Neck:     Thyroid: No thyromegaly.  Cardiovascular:     Rate and Rhythm: Normal rate and regular rhythm.     Heart sounds: Normal  heart sounds. No murmur heard. Pulmonary:     Effort: Pulmonary effort is normal.     Breath sounds: Normal breath sounds.  Abdominal:     Palpations: Abdomen is soft.     Tenderness: There is no abdominal tenderness. There is no guarding or rebound.  Musculoskeletal:        General: Normal range of motion.     Cervical back: Normal range of motion.  Lymphadenopathy:     Cervical: No cervical adenopathy.  Neurological:     General: No focal deficit present.     Mental Status: She is alert and oriented to person, place, and time.     Cranial Nerves: No cranial nerve deficit.  Skin:    General: Skin is warm and dry.  Psychiatric:        Mood and Affect: Mood normal.        Behavior: Behavior normal.        Thought Content: Thought content normal.        Judgment: Judgment normal.  Vitals reviewed.     Assessment/Plan:  Encounter for annual routine gynecological examination  Encounter for screening mammogram for malignant neoplasm of breast; pt current on mammo, followed by oncology  Ductal carcinoma in situ (DCIS) of left breast  Perimenopause--f/u prn AUB         GYN counsel breast self exam, mammography screening, menopause, adequate intake of calcium and vitamin D, diet and exercise    F/U  Return in about 1 year (around 08/15/2023).  Veronica Guerrant B. Lateya Dauria, PA-C 08/15/2022 3:01 PM

## 2022-08-15 ENCOUNTER — Encounter: Payer: Self-pay | Admitting: Obstetrics and Gynecology

## 2022-08-15 ENCOUNTER — Ambulatory Visit (INDEPENDENT_AMBULATORY_CARE_PROVIDER_SITE_OTHER): Payer: Medicaid Other | Admitting: Obstetrics and Gynecology

## 2022-08-15 VITALS — BP 140/80 | HR 92 | Ht 62.0 in | Wt 324.0 lb

## 2022-08-15 DIAGNOSIS — Z01419 Encounter for gynecological examination (general) (routine) without abnormal findings: Secondary | ICD-10-CM

## 2022-08-15 DIAGNOSIS — Z1231 Encounter for screening mammogram for malignant neoplasm of breast: Secondary | ICD-10-CM

## 2022-08-15 DIAGNOSIS — D0512 Intraductal carcinoma in situ of left breast: Secondary | ICD-10-CM

## 2022-08-15 NOTE — Patient Instructions (Signed)
I value your feedback and you entrusting us with your care. If you get a [facility] patient survey, I would appreciate you taking the time to let us know about your experience today. Thank you! ? ? ?

## 2022-10-27 ENCOUNTER — Emergency Department
Admission: EM | Admit: 2022-10-27 | Discharge: 2022-10-28 | Disposition: A | Discharge status: Home / Self Care | Payer: Medicaid Other | Attending: Emergency Medicine | Admitting: Emergency Medicine

## 2022-10-27 ENCOUNTER — Emergency Department: Payer: Medicaid Other

## 2022-10-27 ENCOUNTER — Emergency Department
Admission: EM | Admit: 2022-10-27 | Discharge: 2022-10-27 | Discharge status: Against Medical Advice | Payer: Medicaid Other | Source: Home / Self Care

## 2022-10-27 DIAGNOSIS — N939 Abnormal uterine and vaginal bleeding, unspecified: Secondary | ICD-10-CM | POA: Insufficient documentation

## 2022-10-27 LAB — CBC
HCT: 29.1 % — ABNORMAL LOW (ref 36.0–46.0)
Hemoglobin: 9.1 g/dL — ABNORMAL LOW (ref 12.0–15.0)
MCH: 25.5 pg — ABNORMAL LOW (ref 26.0–34.0)
MCHC: 31.3 g/dL (ref 30.0–36.0)
MCV: 81.5 fL (ref 80.0–100.0)
Platelets: 289 10*3/uL (ref 150–400)
RBC: 3.57 MIL/uL — ABNORMAL LOW (ref 3.87–5.11)
RDW: 16.6 % — ABNORMAL HIGH (ref 11.5–15.5)
WBC: 8.5 10*3/uL (ref 4.0–10.5)
nRBC: 0 % (ref 0.0–0.2)

## 2022-10-27 MED ORDER — TRANEXAMIC ACID 650 MG PO TABS: 1300.0000 mg | ORAL_TABLET | Freq: Three times a day (TID) | ORAL | 0 refills | Status: AC

## 2022-10-27 NOTE — Discharge Instructions (Addendum)
Please seek medical attention for any high fevers, chest pain, shortness of breath, change in behavior, persistent vomiting, bloody stool or any other new or concerning symptoms.  

## 2022-10-27 NOTE — ED Provider Notes (Signed)
[facility] Provider Note    Event Date/Time   First MD Initiated Contact with Patient 10/27/22 1929     (approximate)   History   Vaginal Bleeding   HPI  Felicia Ford is a 57 y.o. female who presents to the emergency department today because of concerns for abnormal vaginal bleeding.  She says that she has been having vaginal bleeding for roughly 2 weeks.  This has been accompanied by right lower abdominal discomfort.  She does have irregular periods at this time although not been this heavy.  Patient has not had any shortness of breath or weakness.     Physical Exam   Triage Vital Signs: ED Triage Vitals  Encounter Vitals Group     BP 10/27/22 1851 135/77     Systolic BP Percentile --      Diastolic BP Percentile --      Pulse Rate 10/27/22 1851 87     Resp 10/27/22 1851 18     Temp 10/27/22 1851 98 F (36.7 C)     Temp Source 10/27/22 1851 Oral     SpO2 10/27/22 1851 99 %     Weight 10/27/22 1852 (!) 321 lb (145.6 kg)     Height 10/27/22 1852 5\' 2"  (1.575 m)     Head Circumference --      Peak Flow --      Pain Score 10/27/22 1852 6     Pain Loc --      Pain Education --      Exclude from Growth Chart --     Most recent vital signs: Vitals:   10/27/22 2230 10/27/22 2241  BP:  127/64  Pulse: 73   Resp:    Temp:    SpO2: 100%    General: Awake, alert, oriented. CV:  Good peripheral perfusion. Regular rate and rhythm. Resp:  Normal effort. Lungs clear. Abd:  No distention. Non tender.   ED Results / Procedures / Treatments   Labs (all labs ordered are listed, but only abnormal results are displayed) Labs Reviewed  CBC - Abnormal; Notable for the following components:      Result Value   RBC 3.57 (*)    Hemoglobin 9.1 (*)    HCT 29.1 (*)    MCH 25.5 (*)    RDW 16.6 (*)    All other components within normal limits     EKG  None   RADIOLOGY I independently interpreted and visualized the US pelvis. My  interpretation: No free fluid Radiology interpretation:  IMPRESSION:  1. Thickened endometrium measuring up to 23.7 mm. If bleeding  remains unresponsive to hormonal or medical therapy, focal lesion  work-up with sonohysterogram should be considered. Endometrial  biopsy should also be considered in pre-menopausal patients at high  risk for endometrial carcinoma. (Ref: Radiological Reasoning:  Algorithmic Workup of Abnormal Vaginal Bleeding with Endovaginal  Sonography and Sonohysterography. AJR 2008; 213:Y86-57)  2. Small uterine fibroids. Nonvisualized left ovary.    PROCEDURES:  Critical Care performed: No   MEDICATIONS ORDERED IN ED: Medications - No data to display   IMPRESSION / MDM / ASSESSMENT AND PLAN / ED COURSE  I reviewed the triage vital signs and the nursing notes.                              Differential diagnosis includes, but is not limited to, mass, fibroid, hormonal issue  Patient's presentation  is most consistent with acute presentation with potential threat to life or bodily function.   The patient is on the cardiac monitor to evaluate for evidence of arrhythmia and/or significant heart rate changes.  Patient presented to the emergency department today because of concerns for abnormal vaginal bleeding.  On exam patient without any abdominal discomfort.  Blood work without any significant anemia.  Did obtain an ultrasound which was concerning for thickened endometrium.  I discussed this with patient.  Discussed trial of medication to help stop the bleeding and necessity of OB/GYN follow-up.  Given a history of breast cancer did opt to stay away from hormonal treatments.  Will try patient on TXA.  Discussed blood clot risk.      FINAL CLINICAL IMPRESSION(S) / ED DIAGNOSES   Final diagnoses:  Abnormal uterine bleeding (AUB)      Note:  This document was prepared using Dragon voice recognition software and may include unintentional dictation  errors.     Phineas Semen, MD 10/28/22 [phone]

## 2022-10-27 NOTE — ED Triage Notes (Signed)
Pt presents to the ED POV from home. Pt states that she has been on her menstrual cycle since the 21st of July. Pt states that it is extremely heavy bleeding. Reports already being anemic and feels more fatigued. Pt states that she is premenopausal and this is her first period in 6 months. States that she can't go anywhere without bleeding through her clothes and it is affecting her daily life. Pt states that she is bleeding through a tampon and pad sometimes within an hour. Pt also reports abdominal cramping.

## 2022-10-27 NOTE — ED Notes (Signed)
Pt ambulatory to the bathroom at this time. Pt c/o pain 10/10 in her lower abdomen. Blood observed in the toilet.

## 2022-10-27 NOTE — ED Notes (Signed)
Green top sent to lab

## 2022-11-09 ENCOUNTER — Other Ambulatory Visit (HOSPITAL_COMMUNITY)
Admission: RE | Admit: 2022-11-09 | Discharge: 2022-11-09 | Disposition: A | Discharge status: Home / Self Care | Payer: Medicaid Other | Source: Ambulatory Visit | Attending: Obstetrics and Gynecology | Admitting: Obstetrics and Gynecology

## 2022-11-09 ENCOUNTER — Ambulatory Visit (INDEPENDENT_AMBULATORY_CARE_PROVIDER_SITE_OTHER): Payer: Medicaid Other | Admitting: Obstetrics and Gynecology

## 2022-11-09 ENCOUNTER — Encounter: Payer: Self-pay | Admitting: Obstetrics and Gynecology

## 2022-11-09 VITALS — BP 108/72 | HR 91 | Ht 62.0 in | Wt 319.5 lb

## 2022-11-09 DIAGNOSIS — R9389 Abnormal findings on diagnostic imaging of other specified body structures: Secondary | ICD-10-CM | POA: Insufficient documentation

## 2022-11-09 DIAGNOSIS — N926 Irregular menstruation, unspecified: Secondary | ICD-10-CM

## 2022-11-09 DIAGNOSIS — D250 Submucous leiomyoma of uterus: Secondary | ICD-10-CM

## 2022-11-09 DIAGNOSIS — D259 Leiomyoma of uterus, unspecified: Secondary | ICD-10-CM

## 2022-11-09 NOTE — Progress Notes (Signed)
Patient presents today to discuss abnormal heavy bleeding. She states this bleeding began 23 days ago and has varied from heavy to light along with severe cramping. She completed a round of TXA with no relief. No additional concerns.

## 2022-11-09 NOTE — Progress Notes (Signed)
HPI:      [name] is a 57 y.o. [id number] who LMP was No LMP recorded (lmp unknown). Patient is perimenopausal.  Subjective:   She presents today after being seen in the emergency department for heavy vaginal bleeding.  She has had irregular cycles over the last several years.  An FSH was recommended 2 years ago with a follow-up endometrial biopsy.  Patient did not have this test performed.  A recent ultrasound in emergency department shows a thickened endometrium with a submucosal fibroid.  Patient states that her bleeding is now very light.  She says her last period was 6 months ago.    Hx: The following portions of the patient's history were reviewed and updated as appropriate:             She  has a past medical history of Anemia, Breast cancer (HCC) (08/26/2014), Elevated blood pressure, Personal history of radiation therapy (10/07/2014), and Sleep apnea. She does not have any pertinent problems on file. She  has a past surgical history that includes Breast Mammosite (Left, 10/07/2014); Colonoscopy with propofol (N/A, 09/21/2016); Breast biopsy (Left, 08/26/2014); Breast biopsy (Left, 09/18/2014); and Breast lumpectomy (Left, 09/18/2014). Her family history includes Breast cancer in her cousin; Diabetes in her mother; Lung cancer in her maternal uncle; Lupus in her sister; Ovarian cancer in her maternal grandmother. She  reports that she has never smoked. She has never used smokeless tobacco. She reports that she does not drink alcohol and does not use drugs. She has a current medication list which includes the following prescription(s): amlodipine, accu-chek guide me, ferrous sulfate, accu-chek guide, hydrochlorothiazide, losartan, meloxicam, pravastatin, and trulicity. She is allergic to latex.       Review of Systems:  Review of Systems  Constitutional: Denied constitutional symptoms, night sweats, recent illness, fatigue, fever, insomnia and weight loss.  Eyes: Denied eye symptoms,  eye pain, photophobia, vision change and visual disturbance.  Ears/Nose/Throat/Neck: Denied ear, nose, throat or neck symptoms, hearing loss, nasal discharge, sinus congestion and sore throat.  Cardiovascular: Denied cardiovascular symptoms, arrhythmia, chest pain/pressure, edema, exercise intolerance, orthopnea and palpitations.  Respiratory: Denied pulmonary symptoms, asthma, pleuritic pain, productive sputum, cough, dyspnea and wheezing.  Gastrointestinal: Denied, gastro-esophageal reflux, melena, nausea and vomiting.  Genitourinary: See HPI for additional information.  Musculoskeletal: Denied musculoskeletal symptoms, stiffness, swelling, muscle weakness and myalgia.  Dermatologic: Denied dermatology symptoms, rash and scar.  Neurologic: Denied neurology symptoms, dizziness, headache, neck pain and syncope.  Psychiatric: Denied psychiatric symptoms, anxiety and depression.  Endocrine: Denied endocrine symptoms including hot flashes and night sweats.   Meds:   Current Outpatient Medications on File Prior to Visit  Medication Sig Dispense Refill   amLODipine (NORVASC) 5 MG tablet TK 1 T PO QD     Blood Glucose Monitoring Suppl (ACCU-CHEK GUIDE ME) w/Device KIT USE TO CHECK BLOOD SUGAR IN THE MORNINGS     ferrous sulfate 325 (65 FE) MG EC tablet TAKE 1 TABLET BY MOUTH EVERY MORNING AND 1 TABLET EVERY EVENING WITH MEALS     glucose blood (ACCU-CHEK GUIDE) test strip USE TO CHECK BLOOD SUGAR ONCE A DAY     hydrochlorothiazide (HYDRODIURIL) 25 MG tablet Take 1 tablet by mouth daily.     losartan (COZAAR) 50 MG tablet Take 50 mg by mouth daily.     meloxicam (MOBIC) 7.5 MG tablet Take 7.5 mg by mouth 2 (two) times daily.     pravastatin (PRAVACHOL) 20 MG tablet Take 20 mg by  mouth daily.     TRULICITY 3 MG/0.5ML SOPN Inject into the skin. (Patient not taking: Reported on 08/15/2022)     No current facility-administered medications on file prior to visit.      Objective:     Vitals:    11/09/22 1011  BP: 108/72  Pulse: 91   Filed Weights   11/09/22 1011  Weight: (!) 319 lb 8 oz (144.9 kg)              Physical examination   Pelvic:   Vulva: Normal appearance.  No lesions.  Vagina: No lesions or abnormalities noted.  Support: Normal pelvic support.  Urethra No masses tenderness or scarring.  Meatus Normal size without lesions or prolapse.  Cervix: Normal appearance.  No lesions.  Anus: Normal exam.  No lesions.  Perineum: Normal exam.  No lesions.        Bimanual   Uterus: Normal size.  Non-tender.  Mobile.  AV.  Adnexae: No masses.  Non-tender to palpation.  Cul-de-sac: Negative for abnormality.   Exam limited by patient body habitus  Endometrial Biopsy After discussion with the patient regarding her abnormal uterine bleeding I recommended that she proceed with an endometrial biopsy for further diagnosis. The risks, benefits, alternatives, and indications for an endometrial biopsy were discussed with the patient in detail. She understood the risks including infection, bleeding, cervical laceration and uterine perforation.  Verbal consent was obtained.   PROCEDURE NOTE:  Vacurette endometrial biopsy was performed using aseptic technique with iodine preparation.  The uterus was sounded to a length of 15 cm.  Adequate sampling was obtained with minimal blood loss.  The patient tolerated the procedure well.  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 Abnormal bleeding in menstrual cycle   2. Thickened endometrium   3. Submucous leiomyoma of uterus   4. Uterine leiomyoma, unspecified location      Patient likely menopausal with postmenopausal bleeding.  Thickened endometrium by ultrasound is concerning for hyperplasia or malignancy.  Bleeding may be a result of a submucosal fibroid but hyperplasia or malignancy must be ruled out first.   Plan:            1.  Endometrial biopsy performed-await results and discussed management at that time. Orders No orders of the defined types were placed in this encounter.   No orders of the defined types were placed in this encounter.     F/U  Return in about 2 weeks (around 11/23/2022) for Video visit if desired, We will contact her with any abnormal test results.  Elonda Husky, M.D. 11/09/2022 10:48 AM

## 2022-11-10 LAB — SURGICAL PATHOLOGY

## 2022-11-15 ENCOUNTER — Telehealth: Payer: Self-pay | Admitting: Obstetrics and Gynecology

## 2022-11-15 NOTE — Telephone Encounter (Signed)
Spoke with patient.

## 2022-11-15 NOTE — Telephone Encounter (Signed)
This patient is returning call from [place] for results. Please advise? The patient is awaiting a return call to go over results.

## 2022-11-15 NOTE — Progress Notes (Signed)
LVM for patient to return call. 

## 2022-11-23 ENCOUNTER — Telehealth (INDEPENDENT_AMBULATORY_CARE_PROVIDER_SITE_OTHER): Payer: Medicaid Other | Admitting: Obstetrics and Gynecology

## 2022-11-23 ENCOUNTER — Encounter: Payer: Self-pay | Admitting: Obstetrics and Gynecology

## 2022-11-23 DIAGNOSIS — N939 Abnormal uterine and vaginal bleeding, unspecified: Secondary | ICD-10-CM | POA: Diagnosis not present

## 2022-11-23 DIAGNOSIS — N840 Polyp of corpus uteri: Secondary | ICD-10-CM

## 2022-11-23 NOTE — Progress Notes (Signed)
Virtual Visit via Video Note  I connected with Felicia Ford on 11/23/22 at  7:55 AM EDT by video and verified that I was speaking with the correct person using two identifiers.    [name] is a 57 y.o. [phone] who LMP was No LMP recorded (lmp unknown). Patient is perimenopausal. I discussed the limitations, risks, security and privacy concerns of performing an evaluation and management service by video and the availability of in person appointments. I also discussed with the patient that there may be a patient responsible charge related to this service. The patient expressed understanding and agreed to proceed.  Location of patient:  car  Patient gave explicit verbal consent for video visit:  YES  Location of provider:  [facility] office  Persons other than physician and patient involved in provider conference:  None   Subjective:   History of Present Illness:    She has a history of abnormal bleeding and thickened endometrium.  She underwent endometrial biopsy.  This revealed an endometrial polyp without hyperplasia.  She presents today to discuss this finding.  She is currently not bleeding!   Hx: The following portions of the patient's history were reviewed and updated as appropriate:             She  has a past medical history of Anemia, Breast cancer (HCC) (08/26/2014), Elevated blood pressure, Personal history of radiation therapy (10/07/2014), and Sleep apnea. She does not have any pertinent problems on file. She  has a past surgical history that includes Breast Mammosite (Left, 10/07/2014); Colonoscopy with propofol (N/A, 09/21/2016); Breast biopsy (Left, 08/26/2014); Breast biopsy (Left, 09/18/2014); and Breast lumpectomy (Left, 09/18/2014). Her family history includes Breast cancer in her cousin; Diabetes in her mother; Lung cancer in her maternal uncle; Lupus in her sister; Ovarian cancer in her maternal grandmother. She  reports that she has never smoked. She has never used  smokeless tobacco. She reports that she does not drink alcohol and does not use drugs. She has a current medication list which includes the following prescription(s): amlodipine, accu-chek guide me, ferrous sulfate, accu-chek guide, hydrochlorothiazide, losartan, meloxicam, pravastatin, and trulicity. She is allergic to latex.       Review of Systems:  Review of Systems  Constitutional: Denied constitutional symptoms, night sweats, recent illness, fatigue, fever, insomnia and weight loss.  Eyes: Denied eye symptoms, eye pain, photophobia, vision change and visual disturbance.  Ears/Nose/Throat/Neck: Denied ear, nose, throat or neck symptoms, hearing loss, nasal discharge, sinus congestion and sore throat.  Cardiovascular: Denied cardiovascular symptoms, arrhythmia, chest pain/pressure, edema, exercise intolerance, orthopnea and palpitations.  Respiratory: Denied pulmonary symptoms, asthma, pleuritic pain, productive sputum, cough, dyspnea and wheezing.  Gastrointestinal: Denied, gastro-esophageal reflux, melena, nausea and vomiting.  Genitourinary: See HPI for additional information.  Musculoskeletal: Denied musculoskeletal symptoms, stiffness, swelling, muscle weakness and myalgia.  Dermatologic: Denied dermatology symptoms, rash and scar.  Neurologic: Denied neurology symptoms, dizziness, headache, neck pain and syncope.  Psychiatric: Denied psychiatric symptoms, anxiety and depression.  Endocrine: Denied endocrine symptoms including hot flashes and night sweats.   Meds:   Current Outpatient Medications on File Prior to Visit  Medication Sig Dispense Refill   amLODipine (NORVASC) 5 MG tablet TK 1 T PO QD     Blood Glucose Monitoring Suppl (ACCU-CHEK GUIDE ME) w/Device KIT USE TO CHECK BLOOD SUGAR IN THE MORNINGS     ferrous sulfate 325 (65 FE) MG EC tablet TAKE 1 TABLET BY MOUTH EVERY MORNING AND 1 TABLET EVERY  EVENING WITH MEALS     glucose blood (ACCU-CHEK GUIDE) test strip USE TO  CHECK BLOOD SUGAR ONCE A DAY     hydrochlorothiazide (HYDRODIURIL) 25 MG tablet Take 1 tablet by mouth daily.     losartan (COZAAR) 50 MG tablet Take 50 mg by mouth daily.     meloxicam (MOBIC) 7.5 MG tablet Take 7.5 mg by mouth 2 (two) times daily.     pravastatin (PRAVACHOL) 20 MG tablet Take 20 mg by mouth daily.     TRULICITY 3 MG/0.5ML SOPN Inject into the skin. (Patient not taking: Reported on 08/15/2022)     No current facility-administered medications on file prior to visit.    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 Abnormal uterine bleeding due to endometrial polyp       Plan:            1.  We have discussed endometrial polyps and there association with abnormal bleeding.  Need for D&C and hysteroscopy to remove polyp discussed.  All questions answered.  Patient was scheduled for preop appointment.   Orders No orders of the defined types were placed in this encounter.   No orders of the defined types were placed in this encounter.     F/U  Return in about 2 weeks (around 12/07/2022).   Elonda Husky, M.D. 11/23/2022 8:59 AM

## 2022-12-13 ENCOUNTER — Encounter: Payer: Self-pay | Admitting: Obstetrics and Gynecology

## 2022-12-13 ENCOUNTER — Ambulatory Visit (INDEPENDENT_AMBULATORY_CARE_PROVIDER_SITE_OTHER): Payer: Medicaid Other | Admitting: Obstetrics and Gynecology

## 2022-12-13 VITALS — BP 150/82 | HR 76 | Ht 62.0 in | Wt 325.2 lb

## 2022-12-13 DIAGNOSIS — N951 Menopausal and female climacteric states: Secondary | ICD-10-CM | POA: Diagnosis not present

## 2022-12-13 DIAGNOSIS — N840 Polyp of corpus uteri: Secondary | ICD-10-CM

## 2022-12-13 DIAGNOSIS — Z01818 Encounter for other preprocedural examination: Secondary | ICD-10-CM

## 2022-12-13 NOTE — Progress Notes (Signed)
Patient presents today for a pre-op exam prior to D&C/hysteroscopy due to an endometrial polyp. Reports no bleeding since her last visit. She states no additional concerns today.

## 2022-12-13 NOTE — Progress Notes (Signed)
HPI:      [name] is a 57 y.o. [phone] who LMP was No LMP recorded. Patient is perimenopausal.  Subjective:   She presents today because in July she was having bleeding almost every day and it was assumed to be postmenopausal.  She underwent an ultrasound which showed a submucosal fibroid and a thickened endometrium.  An endometrial biopsy was performed showing what seems to be an endometrial polyp.  She is now had no bleeding for more than 6 weeks.  It is not currently knowing whether or not she is menopausal. A D&C was recommended for polypectomy to control her bleeding.  Now that she is not bleeding she thinks that a D&C might not be necessary.    Hx: The following portions of the patient's history were reviewed and updated as appropriate:             She  has a past medical history of Anemia, Breast cancer (HCC) (08/26/2014), Elevated blood pressure, Personal history of radiation therapy (10/07/2014), and Sleep apnea. She does not have any pertinent problems on file. She  has a past surgical history that includes Breast Mammosite (Left, 10/07/2014); Colonoscopy with propofol (N/A, 09/21/2016); Breast biopsy (Left, 08/26/2014); Breast biopsy (Left, 09/18/2014); and Breast lumpectomy (Left, 09/18/2014). Her family history includes Breast cancer in her cousin; Diabetes in her mother; Lung cancer in her maternal uncle; Lupus in her sister; Ovarian cancer in her maternal grandmother. She  reports that she has never smoked. She has never used smokeless tobacco. She reports that she does not drink alcohol and does not use drugs. She has a current medication list which includes the following prescription(s): amlodipine, accu-chek guide me, ferrous sulfate, accu-chek guide, hydrochlorothiazide, losartan, meloxicam, pravastatin, and trulicity. She is allergic to latex.       Review of Systems:  Review of Systems  Constitutional: Denied constitutional symptoms, night sweats, recent illness,  fatigue, fever, insomnia and weight loss.  Eyes: Denied eye symptoms, eye pain, photophobia, vision change and visual disturbance.  Ears/Nose/Throat/Neck: Denied ear, nose, throat or neck symptoms, hearing loss, nasal discharge, sinus congestion and sore throat.  Cardiovascular: Denied cardiovascular symptoms, arrhythmia, chest pain/pressure, edema, exercise intolerance, orthopnea and palpitations.  Respiratory: Denied pulmonary symptoms, asthma, pleuritic pain, productive sputum, cough, dyspnea and wheezing.  Gastrointestinal: Denied, gastro-esophageal reflux, melena, nausea and vomiting.  Genitourinary: See HPI for additional information.  Musculoskeletal: Denied musculoskeletal symptoms, stiffness, swelling, muscle weakness and myalgia.  Dermatologic: Denied dermatology symptoms, rash and scar.  Neurologic: Denied neurology symptoms, dizziness, headache, neck pain and syncope.  Psychiatric: Denied psychiatric symptoms, anxiety and depression.  Endocrine: Denied endocrine symptoms including hot flashes and night sweats.   Meds:   Current Outpatient Medications on File Prior to Visit  Medication Sig Dispense Refill   amLODipine (NORVASC) 5 MG tablet TK 1 T PO QD     Blood Glucose Monitoring Suppl (ACCU-CHEK GUIDE ME) w/Device KIT USE TO CHECK BLOOD SUGAR IN THE MORNINGS     ferrous sulfate 325 (65 FE) MG EC tablet TAKE 1 TABLET BY MOUTH EVERY MORNING AND 1 TABLET EVERY EVENING WITH MEALS     glucose blood (ACCU-CHEK GUIDE) test strip USE TO CHECK BLOOD SUGAR ONCE A DAY     hydrochlorothiazide (HYDRODIURIL) 25 MG tablet Take 1 tablet by mouth daily.     losartan (COZAAR) 50 MG tablet Take 50 mg by mouth daily.     meloxicam (MOBIC) 7.5 MG tablet Take 7.5 mg by mouth 2 (two)  times daily.     pravastatin (PRAVACHOL) 20 MG tablet Take 20 mg by mouth daily.     TRULICITY 3 MG/0.5ML SOPN Inject into the skin.     No current facility-administered medications on file prior to visit.       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 Endometrial polyp   2. Perimenopausal     After long discussion regarding the risks and benefits of D&C hysteroscopy and expectant management the patient has now chosen expectant management.  I have advised her that most polyps do not resolve on their own.  I have also explained to her that a submucosal fibroid as well as perimenopause may be causes for her bleeding and that her endometrial biopsy did not show any hyperplasia or malignancy.   Plan:            1.  Expectant management of bleeding.  If patient begins bleeding she will tell us immediately.  To consider D&C hysteroscopy for polypectomy if bleeding continues.  2.  Patient would like an FSH performed to see "whether or not I am in menopause". Orders Orders Placed This Encounter  Procedures   FSH    No orders of the defined types were placed in this encounter.     F/U  No follow-ups on file. I spent 24 minutes involved in the care of this patient preparing to see the patient by obtaining and reviewing her medical history (including labs, imaging tests and prior procedures), documenting clinical information in the electronic health record (EHR), counseling and coordinating care plans, writing and sending prescriptions, ordering tests or procedures and in direct communicating with the patient and medical staff discussing pertinent items from her history and physical  exam.  Elonda Husky, M.D. 12/13/2022 2:36 PM

## 2022-12-20 ENCOUNTER — Telehealth: Payer: Self-pay

## 2022-12-20 NOTE — Telephone Encounter (Signed)
Pt calling; was seen about 2wks ago and had blood drawn; not sure if [facility] or [facility]; something popped during the process; pt states her hand is still in a lot of pain and is starting to go up her arm.  Has never had this happen before and needs to talk with someone. [phone]

## 2022-12-21 NOTE — Telephone Encounter (Signed)
Spoke with patient. She states she is a hard stick and her blood had to be drawn from her hand. She states she felt a popped followed by an electrical shock and pain into her finger. She advised she yelled "Oh, that hurt" when it happened. She has been in pain since then. She has not taken/done anything to alleviate the pain. Advised to use an ice pack and try tylenol or ibuprofen. She can also try a compression wrap. Advised to be sure it is not too tight. She will report back tomorrow if she is still in pain and needs to have a provider look at it.

## 2022-12-27 NOTE — Telephone Encounter (Signed)
Notified in house [facility] tech Mia Gwynn who performed this patient's blood draw. She is reporting to her supervisor.

## 2023-02-27 ENCOUNTER — Ambulatory Visit: Payer: Medicaid Other | Admitting: Family Medicine

## 2023-03-03 ENCOUNTER — Ambulatory Visit: Payer: Medicaid Other | Admitting: Physician Assistant

## 2023-03-03 ENCOUNTER — Encounter: Payer: Self-pay | Admitting: Physician Assistant

## 2023-03-03 ENCOUNTER — Other Ambulatory Visit: Payer: Self-pay | Admitting: Physician Assistant

## 2023-03-03 DIAGNOSIS — D508 Other iron deficiency anemias: Secondary | ICD-10-CM

## 2023-03-03 DIAGNOSIS — R7303 Prediabetes: Secondary | ICD-10-CM | POA: Diagnosis not present

## 2023-03-03 DIAGNOSIS — Z7689 Persons encountering health services in other specified circumstances: Secondary | ICD-10-CM

## 2023-03-03 DIAGNOSIS — I10 Essential (primary) hypertension: Secondary | ICD-10-CM | POA: Diagnosis not present

## 2023-03-03 DIAGNOSIS — E782 Mixed hyperlipidemia: Secondary | ICD-10-CM

## 2023-03-03 MED ORDER — LOSARTAN POTASSIUM 50 MG PO TABS: 50.0000 mg | ORAL_TABLET | Freq: Every day | ORAL | 0 refills | Status: DC

## 2023-03-03 MED ORDER — HYDROCHLOROTHIAZIDE 25 MG PO TABS: 25.0000 mg | ORAL_TABLET | Freq: Every day | ORAL | 0 refills | Status: DC

## 2023-03-03 MED ORDER — PRAVASTATIN SODIUM 20 MG PO TABS: 20.0000 mg | ORAL_TABLET | Freq: Every day | ORAL | 0 refills | Status: DC

## 2023-03-03 MED ORDER — AMLODIPINE BESYLATE 5 MG PO TABS: ORAL_TABLET | ORAL | 0 refills | Status: DC

## 2023-03-03 MED ORDER — FERROUS SULFATE 325 (65 FE) MG PO TBEC: DELAYED_RELEASE_TABLET | ORAL | 0 refills | Status: AC

## 2023-03-03 NOTE — Progress Notes (Unsigned)
New patient visit  Patient: Felicia Ford   DOB: [birth year]   57 y.o. Female  MRN: [id number] Visit Date: 03/03/2023  Today's healthcare provider: Debera Lat, PA-C   No chief complaint on file.  Subjective    Felicia Ford is a 57 y.o. female who presents today as a new patient to establish care.  HPI  *** Discussed the use of AI scribe software for clinical note transcription with the patient, who gave verbal consent to proceed.  History of Present Illness            Past Medical History:  Diagnosis Date   Anemia    Breast cancer (HCC) 08/26/2014   Left breast DCIS, ER positive, PR positive. With mammosite rad tx   Elevated blood pressure    PT GOING TO [facility] 09-11-14 FOR RECHECK PF BP TO DETERMINE IT BP MEDS NEED TO BE STARTED   Personal history of radiation therapy 10/07/2014   mammosite   Sleep apnea    Past Surgical History:  Procedure Laterality Date   BREAST BIOPSY Left 08/26/2014   DCIS   BREAST BIOPSY Left 09/18/2014   Procedure: BREAST BIOPSY WITH NEEDLE LOCALIZATION;  Surgeon: Felicia Mayotte, MD;  Location: [facility] ORS;  Service: General;  Laterality: Left;   BREAST LUMPECTOMY Left 09/18/2014   DCIS with clear margins   BREAST MAMMOSITE Left 10/07/2014   Procedure: MAMMOSITE BREAST;  Surgeon: Felicia Mayotte, MD;  Location: [facility] OR[facility];  Service: General;  Laterality: Left;   COLONOSCOPY WITH PROPOFOL N/A 09/21/2016   Procedure: COLONOSCOPY WITH PROPOFOL;  Surgeon: Felicia Mayotte, MD;  Location: [facility] END[facility];  Service: Endoscopy;  Laterality: N/A;   Family Status  Relation Name Status   Mother  Deceased   Sister  Alive   Mat Uncle  Deceased   MGM  Deceased   Cousin Materna 1st Alive  No partnership data on file   Family History  Problem Relation Age of Onset   Diabetes Mother    Lupus Sister    Lung cancer Maternal Uncle    Ovarian cancer Maternal Grandmother        73s   Breast cancer Cousin        14s   Social History    Socioeconomic History   Marital status: Single    Spouse name: Not on file   Number of children: Not on file   Years of education: Not on file   Highest education level: Not on file  Occupational History   Not on file  Tobacco Use   Smoking status: Never   Smokeless tobacco: Never  Vaping Use   Vaping status: Never Used  Substance and Sexual Activity   Alcohol use: No    Alcohol/week: 0.0 standard drinks of alcohol   Drug use: No   Sexual activity: Not Currently    Birth control/protection: None  Other Topics Concern   Not on file  Social History Narrative   Not on file   Social Determinants of Health   Financial Resource Strain: Not on file  Food Insecurity: Not on file  Transportation Needs: Not on file  Physical Activity: Not on file  Stress: Not on file  Social Connections: Not on file   Outpatient Medications Prior to Visit  Medication Sig   amLODipine (NORVASC) 5 MG tablet TK 1 T PO QD   Blood Glucose Monitoring Suppl (ACCU-CHEK GUIDE ME) w/Device KIT USE TO CHECK BLOOD SUGAR IN THE MORNINGS   ferrous  sulfate 325 (65 FE) MG EC tablet TAKE 1 TABLET BY MOUTH EVERY MORNING AND 1 TABLET EVERY EVENING WITH MEALS   glucose blood (ACCU-CHEK GUIDE) test strip USE TO CHECK BLOOD SUGAR ONCE A DAY   hydrochlorothiazide (HYDRODIURIL) 25 MG tablet Take 1 tablet by mouth daily.   losartan (COZAAR) 50 MG tablet Take 50 mg by mouth daily.   meloxicam (MOBIC) 7.5 MG tablet Take 7.5 mg by mouth 2 (two) times daily.   pravastatin (PRAVACHOL) 20 MG tablet Take 20 mg by mouth daily.   TRULICITY 3 MG/0.5ML SOPN Inject into the skin.   No facility-administered medications prior to visit.   Allergies  Allergen Reactions   Latex     Immunization History  Administered Date(s) Administered   PFIZER(Purple Top)SARS-COV-2 Vaccination 09/17/2019, 10/22/2019   PPD Test 09/04/2020   Tdap 09/28/2008    Health Maintenance  Topic Date Due   HIV Screening  Never done   Hepatitis C  Screening  Never done   Zoster Vaccines- Shingrix (1 of 2) Never done   DTaP/Tdap/Td (2 - Td or Tdap) 09/29/2018   COVID-19 Vaccine (3 - Pfizer risk series) 11/19/2019   INFLUENZA VACCINE  10/27/2022   MAMMOGRAM  04/19/2024   Cervical Cancer Screening (HPV/Pap Cotest)  06/15/2026   Colonoscopy  09/22/2026   HPV VACCINES  Aged Out    Patient Care Team: Pediatrics, [facility] And as PCP - General (Family Medicine)  Review of Systems  All other systems reviewed and are negative.  Except see HPI   {Insert previous labs (optional):23779} {See past labs  Heme  Chem  Endocrine  Serology  Results Review (optional):1}   Objective    There were no vitals taken for this visit. {Insert last BP/Wt (optional):23777}{See vitals history (optional):1}   Physical Exam Vitals reviewed.  Constitutional:      General: She is not in acute distress.    Appearance: Normal appearance. She is well-developed. She is not diaphoretic.  HENT:     Head: Normocephalic and atraumatic.  Eyes:     General: No scleral icterus.    Conjunctiva/sclera: Conjunctivae normal.  Neck:     Thyroid: No thyromegaly.  Cardiovascular:     Rate and Rhythm: Normal rate and regular rhythm.     Pulses: Normal pulses.     Heart sounds: Normal heart sounds. No murmur heard. Pulmonary:     Effort: Pulmonary effort is normal. No respiratory distress.     Breath sounds: Normal breath sounds. No wheezing, rhonchi or rales.  Musculoskeletal:     Cervical back: Neck supple.     Right lower leg: No edema.     Left lower leg: No edema.  Lymphadenopathy:     Cervical: No cervical adenopathy.  Skin:    General: Skin is warm and dry.     Findings: No rash.  Neurological:     Mental Status: She is alert and oriented to person, place, and time. Mental status is at baseline.  Psychiatric:        Mood and Affect: Mood normal.        Behavior: Behavior normal.     Depression Screen    11/26/2014     9:14 AM 10/01/2014   10:16 AM  PHQ 2/9 Scores  PHQ - 2 Score 0 0   No results found for any visits on 03/03/23.  Assessment & Plan     *** Assessment and Plan  Encounter to establish care Welcomed to our clinic Reviewed past medical hx, social hx, family hx and surgical hx Pt advised to send all vaccination records or screening   No follow-ups on file.   The patient was advised to call back or seek an in-person evaluation if the symptoms worsen or if the condition fails to improve as anticipated.  I discussed the assessment and treatment plan with the patient. The patient was provided an opportunity to ask questions and all were answered. The patient agreed with the plan and demonstrated an understanding of the instructions.  I, Debera Lat, PA-C have reviewed all documentation for this visit. The documentation on  126/24 for the exam, diagnosis, procedures, and orders are all accurate and complete.  Debera Lat, [facility], MMS [facility] [phone] (phone) [phone] (fax)   [facility]

## 2023-03-04 DIAGNOSIS — I10 Essential (primary) hypertension: Secondary | ICD-10-CM | POA: Insufficient documentation

## 2023-03-04 DIAGNOSIS — R7303 Prediabetes: Secondary | ICD-10-CM | POA: Insufficient documentation

## 2023-03-04 DIAGNOSIS — E782 Mixed hyperlipidemia: Secondary | ICD-10-CM | POA: Insufficient documentation

## 2023-03-04 DIAGNOSIS — D649 Anemia, unspecified: Secondary | ICD-10-CM | POA: Insufficient documentation

## 2023-03-06 NOTE — Telephone Encounter (Signed)
Requested Prescriptions  Refused Prescriptions Disp Refills   amLODipine (NORVASC) 5 MG tablet [Pharmacy Med Name: AMLODIPINE BESYLATE 5MG  TABLETS] 90 tablet     Sig: TAKE 1 TABLET BY MOUTH EVERY DAY     Cardiovascular: Calcium Channel Blockers 2 Failed - 03/03/2023  4:11 PM      Failed - Last BP in normal range    BP Readings from Last 1 Encounters:  03/03/23 (!) 171/80         Passed - Last Heart Rate in normal range    Pulse Readings from Last 1 Encounters:  03/03/23 68         Passed - Valid encounter within last 6 months    Recent Outpatient Visits           3 days ago Obesity, morbid, BMI 50 or higher (HCC)   [facility] [place], [place], PA-C       Future Appointments             In 1 week [place], [place], PA-C [facility]             pravastatin (PRAVACHOL) 20 MG tablet [employer] Med Name: PRAVASTATIN 20MG  TABLETS] 90 tablet     Sig: TAKE 1 TABLET(20 MG) BY MOUTH DAILY     Cardiovascular:  Antilipid - Statins Failed - 03/03/2023  4:11 PM      Failed - Lipid Panel in normal range within the last 12 months    No results found for: "CHOL", "POCCHOL", "CHOLTOT" No results found for: "LDLCALC", "LDLC", "HIRISKLDL", "POCLDL", "LDLDIRECT", "REALLDLC", "TOTLDLC" No results found for: "HDL", "POCHDL" No results found for: "TRIG", "POCTRIG"       Passed - Patient is not pregnant      Passed - Valid encounter within last 12 months    Recent Outpatient Visits           3 days ago Obesity, morbid, BMI 50 or higher (HCC)   [facility] [place], [place], PA-C       Future Appointments             In 1 week Debera Lat, PA-C [facility]

## 2023-03-16 LAB — COMPREHENSIVE METABOLIC PANEL
ALT: 17 [IU]/L (ref 0–32)
AST: 16 [IU]/L (ref 0–40)
Albumin: 3.9 g/dL (ref 3.8–4.9)
Alkaline Phosphatase: 136 [IU]/L — ABNORMAL HIGH (ref 44–121)
BUN/Creatinine Ratio: 14 (ref 9–23)
BUN: 13 mg/dL (ref 6–24)
Bilirubin Total: <0.2 mg/dL (ref 0.0–1.2)
CO2: 22 mmol/L (ref 20–29)
Calcium: 9 mg/dL (ref 8.7–10.2)
Chloride: 106 mmol/L (ref 96–106)
Creatinine, Ser: 0.94 mg/dL (ref 0.57–1.00)
Globulin, Total: 3.4 g/dL (ref 1.5–4.5)
Glucose: 85 mg/dL (ref 70–99)
Potassium: 4.3 mmol/L (ref 3.5–5.2)
Sodium: 142 mmol/L (ref 134–144)
Total Protein: 7.3 g/dL (ref 6.0–8.5)
eGFR: 71 mL/min/{1.73_m2} (ref >59)

## 2023-03-16 LAB — CBC WITH DIFFERENTIAL/PLATELET
Basophils Absolute: 0 10*3/uL (ref 0.0–0.2)
Basos: 0 %
EOS (ABSOLUTE): 0.1 10*3/uL (ref 0.0–0.4)
Eos: 2 %
Hematocrit: 40.4 % (ref 34.0–46.6)
Hemoglobin: 11.6 g/dL (ref 11.1–15.9)
Immature Grans (Abs): 0 10*3/uL (ref 0.0–0.1)
Immature Granulocytes: 0 %
Lymphocytes Absolute: 2.4 10*3/uL (ref 0.7–3.1)
Lymphs: 39 %
MCH: 21.7 pg — ABNORMAL LOW (ref 26.6–33.0)
MCHC: 28.7 g/dL — ABNORMAL LOW (ref 31.5–35.7)
MCV: 76 fL — ABNORMAL LOW (ref 79–97)
Monocytes Absolute: 0.6 10*3/uL (ref 0.1–0.9)
Monocytes: 9 %
Neutrophils Absolute: 2.9 10*3/uL (ref 1.4–7.0)
Neutrophils: 50 %
Platelets: 276 10*3/uL (ref 150–450)
RBC: 5.34 x10E6/uL — ABNORMAL HIGH (ref 3.77–5.28)
RDW: 20.4 % — ABNORMAL HIGH (ref 11.7–15.4)
WBC: 6 10*3/uL (ref 3.4–10.8)

## 2023-03-16 LAB — LIPID PANEL
Chol/HDL Ratio: 3.8 {ratio} (ref 0.0–4.4)
Cholesterol, Total: 185 mg/dL (ref 100–199)
HDL: 49 mg/dL (ref >39)
LDL Chol Calc (NIH): 115 mg/dL — ABNORMAL HIGH (ref 0–99)
Triglycerides: 118 mg/dL (ref 0–149)
VLDL Cholesterol Cal: 21 mg/dL (ref 5–40)

## 2023-03-16 LAB — HEMOGLOBIN A1C
Est. average glucose Bld gHb Est-mCnc: 131 mg/dL
Hgb A1c MFr Bld: 6.2 % — ABNORMAL HIGH (ref 4.8–5.6)

## 2023-03-16 LAB — TSH: TSH: 2.82 u[IU]/mL (ref 0.450–4.500)

## 2023-03-16 NOTE — Progress Notes (Signed)
Established patient visit  Patient: Felicia Ford   DOB: [birth year]   57 y.o. Female  MRN: [id number] Visit Date: 03/17/2023  Today's healthcare provider: Debera Lat, PA-C   Chief Complaint  Patient presents with   Diabetes    Pt has not been able to take her Trulicity. Metformin did not agree with patient the first time she took the rx.    Medical Management of Chronic Issues    Patient needing to discuss lab results   Hypertension   Subjective     HPI     Diabetes   Blurred vision: Absent.  Chest pain: Absent.  Fatigue: Absent.  Foot Ulcerations: Absent.  Nausea: Absent.  Paresthesia of the feet: Absent.  Polydipsia: Absent.  Polyuria: Absent.  Visual changes: Absent.  Vomiting: Absent.  Weight loss: Absent.  Episodes of hypoglycemia: Absent.  Eye exam is current.  Patient does not see a podiatrist.  The patient exercises three times a week. Additional comments: Pt has not been able to take her Trulicity. Metformin did not agree with patient the first time she took the rx.         Medical Management of Chronic Issues    Additional comments: Patient needing to discuss lab results        Hypertension   Compliance with treatment is excellent.  Anxiety: Absent.  Blurred vision: Absent.  Chest pain: Absent.  Chest pressure/discomfort: Absent.  Dyspnea: Absent.  Headaches: Absent.  Lower extremity edema: Absent.  Orthopnea: Absent.  Palpitations: Absent.  Paroxysmal nocturnal dyspnea: Absent.  Syncope: Absent.  The patient exercises three times a week.      Last edited by Acey Lav, CMA on 03/17/2023  4:32 PM.       Discussed the use of AI scribe software for clinical note transcription with the patient, who gave verbal consent to proceed.  History of Present Illness   The patient, with a history of hypertension, anemia, and obesity, presents for a follow-up visit. They report that their blood pressure readings at home have been slightly elevated, with readings such  as 139/89 and 140/81. They are currently on hydrochlorothiazide 5mg  daily, losartan daily, and amlodipine 5mg  daily for hypertension management.  The patient also has a history of anemia, for which they take daily iron supplements. They report a history of heavy menstrual bleeding, which has since stopped after entering menopause. They have been approved for weight loss surgery and are required to see a hematologist prior to the procedure.  In addition, the patient has been diagnosed with prediabetes, with a recent A1c reading close to the diabetes range. They were previously on Trulicity, but have not been able to obtain the medication recently.  Lastly, the patient has been experiencing some swelling in their feet, which they manage by elevating their feet at home and taking hydrochlorothiazide.     Last lab works were done on 12/18//24. LDL was 115 The 10-year ASCVD risk score (Arnett DK, et al., 2019) is: 9.3%  Pt start taking cholesterol medication. CBC showed microcytic anemia, pt continues iron supplements. CMP showed elevated alkaline phosphatase.      11/26/2014    9:14 AM 10/01/2014   10:16 AM  Depression screen PHQ 2/9  Decreased Interest 0 0  Down, Depressed, Hopeless 0 0  PHQ - 2 Score 0 0    Medications: Outpatient Medications Prior to Visit  Medication Sig   amLODipine (NORVASC) 5 MG tablet TK 1 T PO QD   Blood Glucose  Monitoring Suppl (ACCU-CHEK GUIDE ME) w/Device KIT USE TO CHECK BLOOD SUGAR IN THE MORNINGS   ferrous sulfate 325 (65 FE) MG EC tablet TAKE 1 TABLET BY MOUTH EVERY MORNING AND 1 TABLET EVERY EVENING WITH MEALS   glucose blood (ACCU-CHEK GUIDE) test strip USE TO CHECK BLOOD SUGAR ONCE A DAY   hydrochlorothiazide (HYDRODIURIL) 25 MG tablet Take 1 tablet (25 mg total) by mouth daily.   losartan (COZAAR) 50 MG tablet Take 1 tablet (50 mg total) by mouth daily.   meloxicam (MOBIC) 7.5 MG tablet Take 7.5 mg by mouth 2 (two) times daily.   pravastatin  (PRAVACHOL) 20 MG tablet Take 1 tablet (20 mg total) by mouth daily.   TRULICITY 3 MG/0.5ML SOPN Inject into the skin. (Patient not taking: Reported on 03/17/2023)   No facility-administered medications prior to visit.    Review of Systems  All other systems reviewed and are negative.  All negative Except see HPI       Objective    BP (!) 151/81 (BP Location: Right Arm, Patient Position: Sitting, Cuff Size: Normal)   Pulse 76   Ht 5\' 2"  (1.575 m)   Wt (!) 324 lb 14.4 oz (147.4 kg)   SpO2 100%   BMI 59.42 kg/m     Physical Exam Vitals reviewed.  Constitutional:      General: She is not in acute distress.    Appearance: Normal appearance. She is well-developed. She is obese. She is not diaphoretic.  HENT:     Head: Normocephalic and atraumatic.  Eyes:     General: No scleral icterus.    Conjunctiva/sclera: Conjunctivae normal.  Neck:     Thyroid: No thyromegaly.  Cardiovascular:     Rate and Rhythm: Normal rate and regular rhythm.     Pulses: Normal pulses.     Heart sounds: Normal heart sounds. No murmur heard. Pulmonary:     Effort: Pulmonary effort is normal. No respiratory distress.     Breath sounds: Normal breath sounds. No wheezing, rhonchi or rales.  Musculoskeletal:     Cervical back: Neck supple.     Right lower leg: No edema.     Left lower leg: No edema.  Lymphadenopathy:     Cervical: No cervical adenopathy.  Skin:    General: Skin is warm and dry.     Findings: No rash.  Neurological:     Mental Status: She is alert and oriented to person, place, and time. Mental status is at baseline.  Psychiatric:        Mood and Affect: Mood normal.        Behavior: Behavior normal.      No results found for any visits on 03/17/23.      Assessment and Plan     Gastroesophageal Reflux Disease (GERD) Worsening of chronic condition. Persistent heartburn despite over-the-counter omeprazole and antacids. Symptoms include discomfort in the chest area and  nausea. No nocturnal cough noted. -Increase omeprazole to 40mg  daily, starting with half a tablet. -Add Pepcid for faster relief. -Review diet and identify potential trigger foods. -Return for follow-up in three weeks (around January 10th). -Consider referral to a gastroenterologist if symptoms persist.  Constipation Initial presentation with constipation, now resolved. Used Miralax once and currently taking fiber gummies (off-brand Metamucil) daily. -Continue fiber gummies daily. -Ensure adequate water intake.  Hypertension Chronic and unstable Advised to adhere to bp medication: losartan 50mg , hydrochlorothiazide 25mg  Advised low salt diet and regular exercise Pt was advised to measure  her bp at goal  Elevated LDL With The 10-year ASCVD risk score (Arnett DK, et al., 2019) is: 9.3% Advised to adhere/comply with pravastatin 20mg  Low cholesterol diet and daily exercise  Prediabetes A1C was 6.2 Advised low carb diet and regular exercise Will need to recheck and start on medication? If pt agrees  Morbid obesity (HCC) Weight loss of 5% of pt's current weight via healthy diet and daily exercise encouraged. Pt planned to proceed with gastric bypass Will follow-up  Iron deficiency anemia With low hemoglobin and iron, per pt Continue iron supplements. Pt reports that she cannot tolerate  Oral iron and request a referral to hematology for iron in injections. Referral will be sent for evaluation and management per hematology discretion. Pt cannot do gastric surgery unless normalization of her iron /anemia    General Health Maintenance -Continue monitoring menstrual cycle. -Continue weight loss efforts.  No orders of the defined types were placed in this encounter.   Return in about 4 weeks (around 04/14/2023) for chronic disease f/u.   The patient was advised to call back or seek an in-person evaluation if the symptoms worsen or if the condition fails to improve as  anticipated.  I discussed the assessment and treatment plan with the patient. The patient was provided an opportunity to ask questions and all were answered. The patient agreed with the plan and demonstrated an understanding of the instructions.  I, Debera Lat, PA-C have reviewed all documentation for this visit. The documentation on 03/17/2023  for the exam, diagnosis, procedures, and orders are all accurate and complete.  Debera Lat, [facility], MMS [facility] [phone] (phone) [phone] (fax)  [facility]

## 2023-03-17 ENCOUNTER — Ambulatory Visit: Payer: Medicaid Other | Admitting: Physician Assistant

## 2023-03-17 VITALS — BP 151/81 | HR 76 | Ht 62.0 in | Wt 324.9 lb

## 2023-03-17 DIAGNOSIS — E782 Mixed hyperlipidemia: Secondary | ICD-10-CM

## 2023-03-17 DIAGNOSIS — K5909 Other constipation: Secondary | ICD-10-CM

## 2023-03-17 DIAGNOSIS — R7303 Prediabetes: Secondary | ICD-10-CM

## 2023-03-17 DIAGNOSIS — D508 Other iron deficiency anemias: Secondary | ICD-10-CM

## 2023-03-17 DIAGNOSIS — I10 Essential (primary) hypertension: Secondary | ICD-10-CM

## 2023-03-17 DIAGNOSIS — K219 Gastro-esophageal reflux disease without esophagitis: Secondary | ICD-10-CM | POA: Diagnosis not present

## 2023-03-18 DIAGNOSIS — K219 Gastro-esophageal reflux disease without esophagitis: Secondary | ICD-10-CM | POA: Insufficient documentation

## 2023-03-18 DIAGNOSIS — K5909 Other constipation: Secondary | ICD-10-CM | POA: Insufficient documentation

## 2023-04-03 ENCOUNTER — Inpatient Hospital Stay: Payer: Medicaid Other | Admitting: Oncology

## 2023-04-03 ENCOUNTER — Other Ambulatory Visit: Payer: Medicaid Other

## 2023-04-10 ENCOUNTER — Inpatient Hospital Stay: Payer: Medicaid Other

## 2023-04-10 ENCOUNTER — Ambulatory Visit: Payer: Medicaid Other | Admitting: Family Medicine

## 2023-04-10 ENCOUNTER — Inpatient Hospital Stay: Payer: Medicaid Other | Attending: Oncology | Admitting: Oncology

## 2023-04-20 ENCOUNTER — Other Ambulatory Visit: Payer: Self-pay | Admitting: Physician Assistant

## 2023-04-20 DIAGNOSIS — Z1231 Encounter for screening mammogram for malignant neoplasm of breast: Secondary | ICD-10-CM

## 2023-05-10 ENCOUNTER — Inpatient Hospital Stay: Admission: RE | Admit: 2023-05-10 | Payer: Medicaid Other | Source: Ambulatory Visit

## 2023-05-22 ENCOUNTER — Emergency Department: Payer: Medicaid Other

## 2023-05-22 ENCOUNTER — Emergency Department
Admission: EM | Admit: 2023-05-22 | Discharge: 2023-05-22 | Disposition: A | Discharge status: Home / Self Care | Payer: Medicaid Other | Attending: Emergency Medicine | Admitting: Emergency Medicine

## 2023-05-22 ENCOUNTER — Other Ambulatory Visit: Payer: Self-pay

## 2023-05-22 DIAGNOSIS — E876 Hypokalemia: Secondary | ICD-10-CM | POA: Insufficient documentation

## 2023-05-22 DIAGNOSIS — M5432 Sciatica, left side: Secondary | ICD-10-CM | POA: Insufficient documentation

## 2023-05-22 DIAGNOSIS — R202 Paresthesia of skin: Secondary | ICD-10-CM | POA: Diagnosis present

## 2023-05-22 DIAGNOSIS — I10 Essential (primary) hypertension: Secondary | ICD-10-CM | POA: Diagnosis not present

## 2023-05-22 LAB — COMPREHENSIVE METABOLIC PANEL
ALT: 24 U/L (ref 0–44)
AST: 22 U/L (ref 15–41)
Albumin: 3.7 g/dL (ref 3.5–5.0)
Alkaline Phosphatase: 116 U/L (ref 38–126)
Anion gap: 10 (ref 5–15)
BUN: 17 mg/dL (ref 6–20)
CO2: 24 mmol/L (ref 22–32)
Calcium: 9 mg/dL (ref 8.9–10.3)
Chloride: 105 mmol/L (ref 98–111)
Creatinine, Ser: 0.8 mg/dL (ref 0.44–1.00)
GFR, Estimated: >60 mL/min (ref >60)
Glucose, Bld: 129 mg/dL — ABNORMAL HIGH (ref 70–99)
Potassium: 3.4 mmol/L — ABNORMAL LOW (ref 3.5–5.1)
Sodium: 139 mmol/L (ref 135–145)
Total Bilirubin: 0.5 mg/dL (ref 0.0–1.2)
Total Protein: 8.3 g/dL — ABNORMAL HIGH (ref 6.5–8.1)

## 2023-05-22 LAB — CBC
HCT: 42.7 % (ref 36.0–46.0)
Hemoglobin: 13 g/dL (ref 12.0–15.0)
MCH: 23.6 pg — ABNORMAL LOW (ref 26.0–34.0)
MCHC: 30.4 g/dL (ref 30.0–36.0)
MCV: 77.4 fL — ABNORMAL LOW (ref 80.0–100.0)
Platelets: 335 10*3/uL (ref 150–400)
RBC: 5.52 MIL/uL — ABNORMAL HIGH (ref 3.87–5.11)
RDW: 17.8 % — ABNORMAL HIGH (ref 11.5–15.5)
WBC: 4.6 10*3/uL (ref 4.0–10.5)
nRBC: 0 % (ref 0.0–0.2)

## 2023-05-22 LAB — MAGNESIUM: Magnesium: 2.1 mg/dL (ref 1.7–2.4)

## 2023-05-22 MED ORDER — KETOROLAC TROMETHAMINE 30 MG/ML IJ SOLN
30.0000 mg | Freq: Once | INTRAMUSCULAR | Status: AC
  Administered 2023-05-22: 30 mg via INTRAMUSCULAR
  Filled 2023-05-22: qty 1

## 2023-05-22 MED ORDER — METHOCARBAMOL 500 MG PO TABS
500.0000 mg | ORAL_TABLET | Freq: Once | ORAL | Status: AC
  Administered 2023-05-22: 500 mg via ORAL
  Filled 2023-05-22: qty 1

## 2023-05-22 MED ORDER — LIDOCAINE 5 % EX PTCH: 1.0000 | MEDICATED_PATCH | Freq: Two times a day (BID) | CUTANEOUS | 0 refills | Status: AC

## 2023-05-22 MED ORDER — METHOCARBAMOL 500 MG PO TABS: 500.0000 mg | ORAL_TABLET | Freq: Three times a day (TID) | ORAL | 0 refills | Status: DC | PRN

## 2023-05-22 MED ORDER — LIDOCAINE 5 % EX PTCH
1.0000 | MEDICATED_PATCH | CUTANEOUS | Status: DC
  Administered 2023-05-22: 1 via TRANSDERMAL
  Filled 2023-05-22: qty 1

## 2023-05-22 MED ORDER — POTASSIUM CHLORIDE CRYS ER 20 MEQ PO TBCR
40.0000 meq | EXTENDED_RELEASE_TABLET | Freq: Once | ORAL | Status: AC
  Administered 2023-05-22: 40 meq via ORAL
  Filled 2023-05-22: qty 2

## 2023-05-22 MED ORDER — ACETAMINOPHEN 500 MG PO TABS
1000.0000 mg | ORAL_TABLET | Freq: Once | ORAL | Status: AC
  Administered 2023-05-22: 1000 mg via ORAL
  Filled 2023-05-22: qty 2

## 2023-05-22 NOTE — ED Provider Notes (Signed)
 [facility] Provider Note    Event Date/Time   First MD Initiated Contact with Patient 05/22/23 [phone]     (approximate)   History   Numbness   HPI  Felicia Ford is a 58 y.o. female who presents to the ED for evaluation of Numbness   Review of PCP visit from December.  Morbidly obese patient with BMI nearly 60.  History of GERD, HTN, OSA.  Patient presents for evaluation of numbness, tingling to her left leg.  Symptoms just in the few hours this morning.  No falls, injuries or trauma to her lower back.  No other weakness to any other extremities.  No headache or vision changes.  Reports acute on chronic right knee pain without trauma or injury so she has been "limping" and changing her gait and questions some stiffness she is feeling to her left lower back.  Concerned about a stroke  Also reports about 5 episodes of diarrhea overnight without abdominal pain or emesis.   Physical Exam   Triage Vital Signs: ED Triage Vitals  Encounter Vitals Group     BP 05/22/23 0830 (!) 158/98     Systolic BP Percentile --      Diastolic BP Percentile --      Pulse Rate 05/22/23 0830 92     Resp 05/22/23 0830 17     Temp 05/22/23 0830 97.7 F (36.5 C)     Temp Source 05/22/23 0830 Oral     SpO2 05/22/23 0830 100 %     Weight 05/22/23 0832 (!) 324 lb 15.3 oz (147.4 kg)     Height 05/22/23 0832 5\' 2"  (1.575 m)     Head Circumference --      Peak Flow --      Pain Score 05/22/23 0831 0     Pain Loc --      Pain Education --      Exclude from Growth Chart --     Most recent vital signs: Vitals:   05/22/23 1118 05/22/23 1121  BP:  136/87  Pulse: 81   Resp: 16   Temp:    SpO2: 100%     General: Awake, no distress.  CV:  Good peripheral perfusion.  Resp:  Normal effort.  Abd:  No distention.  MSK:  No deformity noted.  Some mild tenderness to the left lumbar back without overlying skin changes Neuro:  No focal deficits appreciated. Cranial nerves  II through XII intact 5/5 strength and sensation in all 4 extremities  Other:     ED Results / Procedures / Treatments   Labs (all labs ordered are listed, but only abnormal results are displayed) Labs Reviewed  CBC - Abnormal; Notable for the following components:      Result Value   RBC 5.52 (*)    MCV 77.4 (*)    MCH 23.6 (*)    RDW 17.8 (*)    All other components within normal limits  COMPREHENSIVE METABOLIC PANEL - Abnormal; Notable for the following components:   Potassium 3.4 (*)    Glucose, Bld 129 (*)    Total Protein 8.3 (*)    All other components within normal limits  MAGNESIUM    EKG   RADIOLOGY CT head interpreted by me without evidence of acute intracranial pathology  Official radiology report(s): CT HEAD WO CONTRAST ( ) Result Date: 05/22/2023 CLINICAL DATA:  Transient ischemic attack (TIA) with left leg numbness. Pt states she woke up this morning  and could not move her leg and she felt dizzy. Pt denies any other symptoms. Pt called ems who evaluated her for a stroke which was negative EXAM: CT HEAD WITHOUT CONTRAST TECHNIQUE: Contiguous axial images were obtained from the base of the skull through the vertex without intravenous contrast. RADIATION DOSE REDUCTION: This exam was performed according to the departmental dose-optimization program which includes automated exposure control, adjustment of the mA and/or kV according to patient size and/or use of iterative reconstruction technique. COMPARISON:  None Available. FINDINGS: Brain: No evidence of large-territorial acute infarction. No parenchymal hemorrhage. No mass lesion. No extra-axial collection. No mass effect or midline shift. No hydrocephalus. Basilar cisterns are patent. Vascular: No hyperdense vessel. Skull: No acute fracture or focal lesion. Sinuses/Orbits: Paranasal sinuses and mastoid air cells are clear. The orbits are unremarkable. Other: None. IMPRESSION: No acute intracranial abnormality.  Electronically Signed   By: Tish Frederickson M.D.   On: 05/22/2023 08:56    PROCEDURES and INTERVENTIONS:  Procedures  Medications  lidocaine (LIDODERM) 5 % 1 patch (1 patch Transdermal Patch Applied 05/22/23 0931)  ketorolac (TORADOL) 30 MG/ML injection 30 mg (30 mg Intramuscular Given 05/22/23 0931)  acetaminophen (TYLENOL) tablet 1,000 mg (1,000 mg Oral Given 05/22/23 0931)  methocarbamol (ROBAXIN) tablet 500 mg (500 mg Oral Given 05/22/23 0931)  potassium chloride SA (KLOR-CON M) CR tablet 40 mEq (40 mEq Oral Given 05/22/23 1007)     IMPRESSION / MDM / ASSESSMENT AND PLAN / ED COURSE  I reviewed the triage vital signs and the nursing notes.  Differential diagnosis includes, but is not limited to, electrolyte derangement such as hypokalemia or hypomagnesemia, stroke, ICH, sciatica  {Patient presents with symptoms of an acute illness or injury that is potentially life-threatening.  58 year old presents with mostly paresthesias of her left leg.  No reproducible deficit, she has full strength and sensation to light touch on my assessment.  CT head is benign.  Normal CBC.  Mild hypokalemia, we will add on magnesium level considering her preceding diarrhea and electrolyte derangement may be a possibility.  Also high suspicion for sciatica and we will treat this with nonnarcotic multimodal analgesia.  Mild hypokalemia is replaced orally.  Normal magnesium.  Possibly a component of hypokalemia in the setting of diarrhea.  Suspect mostly sciatica.  Discussed similar nonnarcotic multimodal analgesia at home and close ED return precautions.  Clinical Course as of 05/22/23 1126  Mon May 22, 2023  1010 Reassessed, sitting upright in a bedside chair and reports feeling better.  Discussed workup overall. [DS]    Clinical Course User Index [DS] Delton [place], MD     FINAL CLINICAL IMPRESSION(S) / ED DIAGNOSES   Final diagnoses:  Paresthesias  Hypokalemia  Sciatica of left side     Rx / DC  Orders   ED Discharge Orders          Ordered    lidocaine (LIDODERM) 5 %  Every 12 hours        05/22/23 1011    methocarbamol (ROBAXIN) 500 MG tablet  Every 8 hours PRN        05/22/23 1011             Note:  This document was prepared using Dragon voice recognition software and may include unintentional dictation errors.   Delton Prairie, MD 05/22/23 [phone]

## 2023-05-22 NOTE — Discharge Instructions (Addendum)
 Use Tylenol for pain and fevers.  Up to 1000 mg per dose, up to 4 times per day.  Do not take more than 4000 mg of Tylenol/acetaminophen within 24 hours..  Use naproxen/Aleve for anti-inflammatory pain relief. Use up to 500mg  every 12 hours. Do not take more frequently than this. Do not use other NSAIDs (ibuprofen, Advil) while taking this medication. It is safe to take Tylenol with this.   Please use lidocaine patches at your site of pain.  Apply 1 patch at a time, leave on for 12 hours, then remove for 12 hours.  12 hours on, 12 hours off.  Do not apply more than 1 patch at a time.  Use Robaxin muscle relaxer as needed for more severe/breakthrough pain, up to 3 times per day. This medication can make some people sleepy, so do not use while driving, working or operating machinery  Return to the ED with any worsening symptoms despite these measures

## 2023-05-22 NOTE — ED Notes (Signed)
 Pt transported to CT ?

## 2023-05-22 NOTE — ED Triage Notes (Signed)
 Pt here with left leg numbness. Pt states she woke up this morning and could not move her leg and she felt dizzy. Pt denies any other symptoms. Pt called ems who evaluated her for a stroke which was negative. Pt denies any dizziness at the moment. Pt talking in full sentences and face is symmetrical. Pt ambulates to triage with no issue.

## 2023-06-01 ENCOUNTER — Encounter: Payer: Self-pay | Admitting: Radiology

## 2023-06-01 ENCOUNTER — Ambulatory Visit
Admission: RE | Admit: 2023-06-01 | Discharge: 2023-06-01 | Disposition: A | Discharge status: Home / Self Care | Source: Ambulatory Visit | Attending: Physician Assistant | Admitting: Physician Assistant

## 2023-06-01 DIAGNOSIS — Z1231 Encounter for screening mammogram for malignant neoplasm of breast: Secondary | ICD-10-CM | POA: Insufficient documentation

## 2023-06-09 ENCOUNTER — Encounter: Payer: Self-pay | Admitting: Physician Assistant

## 2023-06-12 ENCOUNTER — Other Ambulatory Visit: Payer: Self-pay | Admitting: Physician Assistant

## 2023-06-12 DIAGNOSIS — E782 Mixed hyperlipidemia: Secondary | ICD-10-CM

## 2023-06-12 DIAGNOSIS — I10 Essential (primary) hypertension: Secondary | ICD-10-CM

## 2023-06-14 NOTE — Telephone Encounter (Signed)
 Requested Prescriptions  Pending Prescriptions Disp Refills   hydrochlorothiazide (HYDRODIURIL) 25 MG tablet [Pharmacy Med Name: HYDROCHLOROTHIAZIDE 25MG  TABLETS] 30 tablet 0    Sig: TAKE 1 TABLET(25 MG) BY MOUTH DAILY     Cardiovascular: Diuretics - Thiazide Failed - 06/14/2023  9:13 AM      Failed - K in normal range and within 180 days    Potassium  Date Value Ref Range Status  05/22/2023 3.4 (L) 3.5 - 5.1 mmol/L Final         Passed - Cr in normal range and within 180 days    Creatinine, Ser  Date Value Ref Range Status  05/22/2023 0.80 0.44 - 1.00 mg/dL Final         Passed - Na in normal range and within 180 days    Sodium  Date Value Ref Range Status  05/22/2023 139 135 - 145 mmol/L Final  03/15/2023 142 134 - 144 mmol/L Final         Passed - Last BP in normal range    BP Readings from Last 1 Encounters:  05/22/23 136/87         Passed - Valid encounter within last 6 months    Recent Outpatient Visits           2 months ago Gastroesophageal reflux disease without esophagitis   [place] [facility] [place], [place], PA-C   3 months ago Obesity, morbid, BMI 50 or higher (HCC)   [place] [facility] [place], [place], PA-C               pravastatin (PRAVACHOL) 20 MG tablet [Pharmacy Med Name: PRAVASTATIN 20MG  TABLETS] 30 tablet 0    Sig: TAKE 1 TABLET(20 MG) BY MOUTH DAILY     Cardiovascular:  Antilipid - Statins Failed - 06/14/2023  9:13 AM      Failed - Lipid Panel in normal range within the last 12 months    Cholesterol, Total  Date Value Ref Range Status  03/15/2023 185 100 - 199 mg/dL Final   LDL Chol Calc (NIH)  Date Value Ref Range Status  03/15/2023 115 (H) 0 - 99 mg/dL Final   HDL  Date Value Ref Range Status  03/15/2023 49 >39 mg/dL Final   Triglycerides  Date Value Ref Range Status  03/15/2023 118 0 - 149 mg/dL Final         Passed - Patient is not pregnant      Passed - Valid encounter within last 12  months    Recent Outpatient Visits           2 months ago Gastroesophageal reflux disease without esophagitis   [place] [facility] [place], [place], PA-C   3 months ago Obesity, morbid, BMI 50 or higher (HCC)    [place] [facility] [place], [place], PA-C               amLODipine (NORVASC) 5 MG tablet [Pharmacy Med Name: AMLODIPINE BESYLATE 5MG  TABLETS] 30 tablet 0    Sig: TAKE 1 TABLET BY MOUTH EVERY DAY     Cardiovascular: Calcium Channel Blockers 2 Passed - 06/14/2023  9:13 AM      Passed - Last BP in normal range    BP Readings from Last 1 Encounters:  05/22/23 136/87         Passed - Last Heart Rate in normal range    Pulse Readings from Last 1 Encounters:  05/22/23 81  Passed - Valid encounter within last 6 months    Recent Outpatient Visits           2 months ago Gastroesophageal reflux disease without esophagitis   [place] [facility] [place], [place], PA-C   3 months ago Obesity, morbid, BMI 50 or higher [facility])   [place] [facility] [place], [place], [place]

## 2023-07-21 ENCOUNTER — Other Ambulatory Visit: Payer: Self-pay | Admitting: Physician Assistant

## 2023-07-21 NOTE — Telephone Encounter (Signed)
 Requested medication (s) are due for refill today: review  Requested medication (s) are on the active medication list: yes  Last refill:  08/15/22  Future visit scheduled: no  Notes to clinic:  Historical provider/medication, please review for refill       Requested Prescriptions  Pending Prescriptions Disp Refills   meloxicam (MOBIC) 7.5 MG tablet [Pharmacy Med Name: MELOXICAM 7.5MG  TABLETS] 60 tablet     Sig: TAKE 1 TABLET BY MOUTH TWICE DAILY     Analgesics:  COX2 Inhibitors Failed - 07/21/2023  4:53 PM      Failed - Manual Review: Labs are only required if the patient has taken medication for more than 8 weeks.      Failed - Valid encounter within last 12 months    Recent Outpatient Visits   None            Passed - HGB in normal range and within 360 days    Hemoglobin  Date Value Ref Range Status  05/22/2023 13.0 12.0 - 15.0 g/dL Final  78/29/5621 30.8 11.1 - 15.9 g/dL Final         Passed - Cr in normal range and within 360 days    Creatinine, Ser  Date Value Ref Range Status  05/22/2023 0.80 0.44 - 1.00 mg/dL Final         Passed - HCT in normal range and within 360 days    HCT  Date Value Ref Range Status  05/22/2023 42.7 36.0 - 46.0 % Final   Hematocrit  Date Value Ref Range Status  03/15/2023 40.4 34.0 - 46.6 % Final         Passed - AST in normal range and within 360 days    AST  Date Value Ref Range Status  05/22/2023 22 15 - 41 U/L Final         Passed - ALT in normal range and within 360 days    ALT  Date Value Ref Range Status  05/22/2023 24 0 - 44 U/L Final         Passed - eGFR is 30 or above and within 360 days    GFR calc Af Amer  Date Value Ref Range Status  04/26/2015 >60 >60 mL/min Final    Comment:    (NOTE) The eGFR has been calculated using the CKD EPI equation. This calculation has not been validated in all clinical situations. eGFR's persistently <60 mL/min signify possible Chronic Kidney Disease.    GFR, Estimated   Date Value Ref Range Status  05/22/2023 >60 >60 mL/min Final    Comment:    (NOTE) Calculated using the CKD-EPI Creatinine Equation (2021)    eGFR  Date Value Ref Range Status  03/15/2023 71 >59 mL/min/1.73 Final         Passed - Patient is not pregnant

## 2023-07-31 ENCOUNTER — Ambulatory Visit: Admitting: Physician Assistant

## 2023-07-31 NOTE — Progress Notes (Deleted)
 Established patient visit  Patient: Felicia Ford   DOB: [birth year]   58 y.o. Female  MRN: [id number] Visit Date: 07/31/2023  Today's healthcare provider: Blane Bunting, PA-C   No chief complaint on file.  Subjective       Discussed the use of AI scribe software for clinical note transcription with the patient, who gave verbal consent to proceed.  History of Present Illness        11/26/2014    9:14 AM 10/01/2014   10:16 AM  Depression screen PHQ 2/9  Decreased Interest 0 0  Down, Depressed, Hopeless 0 0  PHQ - 2 Score 0 0       No data to display          Medications: Outpatient Medications Prior to Visit  Medication Sig   amLODipine  (NORVASC ) 5 MG tablet TAKE 1 TABLET BY MOUTH EVERY DAY   Blood Glucose Monitoring Suppl (ACCU-CHEK GUIDE ME) w/Device KIT USE TO CHECK BLOOD SUGAR IN THE MORNINGS   ferrous sulfate  325 (65 FE) MG EC tablet TAKE 1 TABLET BY MOUTH EVERY MORNING AND 1 TABLET EVERY EVENING WITH MEALS   glucose blood (ACCU-CHEK GUIDE) test strip USE TO CHECK BLOOD SUGAR ONCE A DAY   hydrochlorothiazide  (HYDRODIURIL ) 25 MG tablet TAKE 1 TABLET(25 MG) BY MOUTH DAILY   lidocaine  (LIDODERM ) 5 % Place 1 patch onto the skin every 12 (twelve) hours. Remove & Discard patch within 12 hours or as directed by MD   losartan  (COZAAR ) 50 MG tablet Take 1 tablet (50 mg total) by mouth daily.   meloxicam (MOBIC) 7.5 MG tablet Take 7.5 mg by mouth 2 (two) times daily.   methocarbamol  (ROBAXIN ) 500 MG tablet Take 1 tablet (500 mg total) by mouth every 8 (eight) hours as needed.   pravastatin  (PRAVACHOL ) 20 MG tablet TAKE 1 TABLET(20 MG) BY MOUTH DAILY   TRULICITY 3 MG/0.5ML SOPN Inject into the skin. (Patient not taking: Reported on 03/17/2023)   No facility-administered medications prior to visit.    Review of Systems  All other systems reviewed and are negative.  All negative Except see HPI   {Insert previous labs (optional):23779} {See past labs  Heme  Chem   Endocrine  Serology  Results Review (optional):1}   Objective    LMP  (LMP Unknown) Comment: 6 months ago {Insert last BP/Wt (optional):23777}{See vitals history (optional):1}   Physical Exam Vitals reviewed.  Constitutional:      General: She is not in acute distress.    Appearance: Normal appearance. She is well-developed. She is not diaphoretic.  HENT:     Head: Normocephalic and atraumatic.  Eyes:     General: No scleral icterus.    Conjunctiva/sclera: Conjunctivae normal.  Neck:     Thyroid: No thyromegaly.  Cardiovascular:     Rate and Rhythm: Normal rate and regular rhythm.     Pulses: Normal pulses.     Heart sounds: Normal heart sounds. No murmur heard. Pulmonary:     Effort: Pulmonary effort is normal. No respiratory distress.     Breath sounds: Normal breath sounds. No wheezing, rhonchi or rales.  Musculoskeletal:     Cervical back: Neck supple.     Right lower leg: No edema.     Left lower leg: No edema.  Lymphadenopathy:     Cervical: No cervical adenopathy.  Skin:    General: Skin is warm and dry.     Findings: No rash.  Neurological:     Mental Status:  She is alert and oriented to person, place, and time. Mental status is at baseline.  Psychiatric:        Mood and Affect: Mood normal.        Behavior: Behavior normal.      No results found for any visits on 07/31/23.      Assessment and Plan Assessment & Plan     No orders of the defined types were placed in this encounter.   No follow-ups on file.   The patient was advised to call back or seek an in-person evaluation if the symptoms worsen or if the condition fails to improve as anticipated.  I discussed the assessment and treatment plan with the patient. The patient was provided an opportunity to ask questions and all were answered. The patient agreed with the plan and demonstrated an understanding of the instructions.  I, Zipporah Finamore, PA-C have reviewed all documentation for this  visit. The documentation on 07/31/2023  for the exam, diagnosis, procedures, and orders are all accurate and complete.  Blane Bunting, [facility], MMS [facility] [phone] (phone) [phone] (fax)  [facility]

## 2023-08-15 ENCOUNTER — Ambulatory Visit: Admitting: Physician Assistant

## 2023-08-15 ENCOUNTER — Other Ambulatory Visit: Payer: Self-pay | Admitting: Physician Assistant

## 2023-08-15 ENCOUNTER — Encounter: Payer: Self-pay | Admitting: Physician Assistant

## 2023-08-15 VITALS — Resp 16 | Ht 62.0 in | Wt 326.0 lb

## 2023-08-15 DIAGNOSIS — I10 Essential (primary) hypertension: Secondary | ICD-10-CM | POA: Diagnosis not present

## 2023-08-15 DIAGNOSIS — E782 Mixed hyperlipidemia: Secondary | ICD-10-CM

## 2023-08-15 DIAGNOSIS — K5909 Other constipation: Secondary | ICD-10-CM

## 2023-08-15 DIAGNOSIS — R7303 Prediabetes: Secondary | ICD-10-CM | POA: Diagnosis not present

## 2023-08-15 DIAGNOSIS — G8929 Other chronic pain: Secondary | ICD-10-CM

## 2023-08-15 DIAGNOSIS — M25569 Pain in unspecified knee: Secondary | ICD-10-CM

## 2023-08-15 DIAGNOSIS — D508 Other iron deficiency anemias: Secondary | ICD-10-CM | POA: Diagnosis not present

## 2023-08-15 DIAGNOSIS — K219 Gastro-esophageal reflux disease without esophagitis: Secondary | ICD-10-CM

## 2023-08-15 DIAGNOSIS — Z780 Asymptomatic menopausal state: Secondary | ICD-10-CM

## 2023-08-15 MED ORDER — AMLODIPINE BESYLATE 5 MG PO TABS: 5.0000 mg | ORAL_TABLET | Freq: Every day | ORAL | 0 refills | Status: DC

## 2023-08-15 MED ORDER — LOSARTAN POTASSIUM 50 MG PO TABS: 50.0000 mg | ORAL_TABLET | Freq: Every day | ORAL | 0 refills | Status: DC

## 2023-08-15 MED ORDER — HYDROCHLOROTHIAZIDE 25 MG PO TABS
25.0000 mg | ORAL_TABLET | Freq: Every day | ORAL | 0 refills | Status: DC
Start: 2023-08-15 — End: 2023-08-15

## 2023-08-15 MED ORDER — PRAVASTATIN SODIUM 20 MG PO TABS: 20.0000 mg | ORAL_TABLET | Freq: Every day | ORAL | 0 refills | Status: DC

## 2023-08-15 NOTE — Progress Notes (Signed)
 Established patient visit  Patient: Felicia Ford   DOB: May 28, 1965   58 y.o. Female  MRN: [id number] Visit Date: 08/15/2023  Today's healthcare provider: Blane Bunting, PA-C   Chief Complaint  Patient presents with   Follow-up    3 month  f/u constipation concerns , Wants wt loss meds   Subjective       Discussed the use of AI scribe software for clinical note transcription with the patient, who gave verbal consent to proceed.  History of Present Illness Felicia Ford is a 58 year old female with prediabetes and hypertension who presents with constipation and weight management concerns.  She experiences constipation, likely due to iron supplementation for anemia, with black stools and associated stomach pain. She has not been using fiber supplements or a high-fiber diet and seeks over-the-counter solutions.  She is concerned about weight management, noting a slight increase in weight. Menopause and knee pain from a torn ACL limit her ability to exercise. She is exploring weight management medications through Medicaid.  Her prediabetes is managed without medication, as she has not taken Trulicity for over a year due to pharmacy availability issues. Her last A1c was 6.2%.  Current medications include pravastatin , losartan , hydrochlorothiazide , amlodipine , and ferrous sulfate . She takes her blood pressure medications regularly.  She experiences ankle swelling that decreases with elevation, occasional headaches, dizziness, and tinnitus, which she attributes to menopause.       08/15/2023   10:14 AM 11/26/2014    9:14 AM 10/01/2014   10:16 AM  Depression screen PHQ 2/9  Decreased Interest 0 0 0  Down, Depressed, Hopeless 0 0 0  PHQ - 2 Score 0 0 0  Altered sleeping 3    Tired, decreased energy 3    Change in appetite 0    Feeling bad or failure about yourself  0    Trouble concentrating 0    Moving slowly or fidgety/restless 0    Suicidal thoughts 0    PHQ-9 Score 6     Difficult doing work/chores Very difficult         No data to display          Medications: Outpatient Medications Prior to Visit  Medication Sig   amLODipine  (NORVASC ) 5 MG tablet TAKE 1 TABLET BY MOUTH EVERY DAY   Blood Glucose Monitoring Suppl (ACCU-CHEK GUIDE ME) w/Device KIT USE TO CHECK BLOOD SUGAR IN THE MORNINGS   ferrous sulfate  325 (65 FE) MG EC tablet TAKE 1 TABLET BY MOUTH EVERY MORNING AND 1 TABLET EVERY EVENING WITH MEALS   glucose blood (ACCU-CHEK GUIDE) test strip USE TO CHECK BLOOD SUGAR ONCE A DAY   hydrochlorothiazide  (HYDRODIURIL ) 25 MG tablet TAKE 1 TABLET(25 MG) BY MOUTH DAILY   lidocaine  (LIDODERM ) 5 % Place 1 patch onto the skin every 12 (twelve) hours. Remove & Discard patch within 12 hours or as directed by MD   losartan  (COZAAR ) 50 MG tablet Take 1 tablet (50 mg total) by mouth daily.   meloxicam (MOBIC) 7.5 MG tablet Take 7.5 mg by mouth 2 (two) times daily.   methocarbamol  (ROBAXIN ) 500 MG tablet Take 1 tablet (500 mg total) by mouth every 8 (eight) hours as needed.   pravastatin  (PRAVACHOL ) 20 MG tablet TAKE 1 TABLET(20 MG) BY MOUTH DAILY   TRULICITY 3 MG/0.5ML SOPN Inject into the skin. (Patient not taking: Reported on 03/03/2023)   No facility-administered medications prior to visit.    Review of Systems All negative Except see HPI  Objective    Resp 16   Ht 5\' 2"  (1.575 m)   Wt (!) 326 lb (147.9 kg)   LMP  (LMP Unknown) Comment: 6 months ago  SpO2 99%   BMI 59.63 kg/m     Physical Exam Vitals reviewed.  Constitutional:      General: She is not in acute distress.    Appearance: Normal appearance. She is well-developed. She is not diaphoretic.  HENT:     Head: Normocephalic and atraumatic.  Eyes:     General: No scleral icterus.    Conjunctiva/sclera: Conjunctivae normal.  Neck:     Thyroid: No thyromegaly.  Cardiovascular:     Rate and Rhythm: Normal rate and regular rhythm.     Pulses: Normal pulses.     Heart sounds:  Normal heart sounds. No murmur heard. Pulmonary:     Effort: Pulmonary effort is normal. No respiratory distress.     Breath sounds: Normal breath sounds. No wheezing, rhonchi or rales.  Musculoskeletal:     Cervical back: Neck supple.     Right lower leg: No edema.     Left lower leg: No edema.  Lymphadenopathy:     Cervical: No cervical adenopathy.  Skin:    General: Skin is warm and dry.     Findings: No rash.  Neurological:     Mental Status: She is alert and oriented to person, place, and time. Mental status is at baseline.  Psychiatric:        Mood and Affect: Mood normal.        Behavior: Behavior normal.      No results found for any visits on 08/15/23.      Assessment & Plan Iron deficiency anemia Chronic iron deficiency anemia with persistent microcytosis despite improved hemoglobin. Iron infusion recommended. - Order blood work to assess iron levels and hemoglobin. - Consult pharmacist for iron supplements with reduced gastrointestinal side effects. - Consider iron infusion if iron levels are low. - Adjust iron supplementation frequency based on blood work results.  Constipation Chronic constipation likely exacerbated by iron supplementation. Discussed dietary modifications and over-the-counter options. - Recommend high fiber diet and provide resources for dietary changes. - Suggest daily intake of probiotics such as yogurt or kefir. - Advise use of over-the-counter laxatives if needed. - Encourage use of prune juice or prunes for immediate relief.  Hypertension Chronic Hypertension with current blood pressure of 150/71 mmHg. Emphasized adherence to antihypertensive regimen. - Continue current antihypertensive medications: losartan , hydrochlorothiazide , and amlodipine . - Refill prescriptions for antihypertensive medications. - Schedule follow-up in one month to reassess blood pressure management.  Prediabetes Chronic Prediabetes with A1c of 6.2%. Discussed  potential for weight loss medication pending further evaluation. - Order blood work to reassess A1c and other relevant parameters. - Evaluate thyroid function with TSH test. - Consider [phone] for weight loss if thyroid function is normal and insurance approves.  Hyperlipidemia Chronic Hyperlipidemia with previous high lipid levels. Currently on pravastatin . - Refill prescription for pravastatin . - Order blood work to reassess lipid levels.  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 Coordination with [facility] for management. - Follow up with [facility] for ongoing management of knee pain. - Discuss potential for meloxicam prescription with [facility].  Menopausal symptoms Experiencing menopausal symptoms including headaches, dizziness, and tinnitus. Coordination with OBGYN ongoing. - Continue management with OBGYN for menopausal symptoms.  Primary hypertension (Primary)  - CBC with Differential/Platelet -  Comprehensive metabolic panel with GFR - Hemoglobin A1c - Lipid panel - TSH  Prediabetes  - CBC with Differential/Platelet - Comprehensive metabolic panel with GFR - Hemoglobin A1c - Lipid panel - TSH  Mixed hyperlipidemia  - CBC with Differential/Platelet - Comprehensive metabolic panel with GFR - Hemoglobin A1c - Lipid panel - TSH Other constipation  - CBC with Differential/Platelet - Comprehensive metabolic panel with GFR - Hemoglobin A1c - Lipid panel - TSH  Obesity, morbid, BMI 50 or higher (HCC) Chronic \\Body  mass index is 59.63 kg/m. Weight loss of 5% of pt's current weight via healthy diet and daily exercise encouraged. Discussed with loss medication. Needs to recheck tsh and US  thyroid should be considered Workup ordered, see below. Will reassess after  receiving lab results    No orders of the defined types were placed in this encounter.   No follow-ups on file.   The patient was advised  to call back or seek an in-person evaluation if the symptoms worsen or if the condition fails to improve as anticipated.  I discussed the assessment and treatment plan with the patient. The patient was provided an opportunity to ask questions and all were answered. The patient agreed with the plan and demonstrated an understanding of the instructions.  I, Yanessa Hocevar, PA-C have reviewed all documentation for this visit. The documentation on 08/15/2023  for the exam, diagnosis, procedures, and orders are all accurate and complete.  Blane Bunting, [facility], MMS [facility] [phone] (phone) [phone] (fax)  [facility]

## 2023-08-16 LAB — COMPREHENSIVE METABOLIC PANEL WITH GFR
ALT: 16 IU/L (ref 0–32)
AST: 15 IU/L (ref 0–40)
Albumin: 3.7 g/dL — ABNORMAL LOW (ref 3.8–4.9)
Alkaline Phosphatase: 128 IU/L — ABNORMAL HIGH (ref 44–121)
BUN/Creatinine Ratio: 14 (ref 9–23)
BUN: 13 mg/dL (ref 6–24)
Bilirubin Total: 0.2 mg/dL (ref 0.0–1.2)
CO2: 19 mmol/L — ABNORMAL LOW (ref 20–29)
Calcium: 9 mg/dL (ref 8.7–10.2)
Chloride: 107 mmol/L — ABNORMAL HIGH (ref 96–106)
Creatinine, Ser: 0.91 mg/dL (ref 0.57–1.00)
Globulin, Total: 3 g/dL (ref 1.5–4.5)
Glucose: 91 mg/dL (ref 70–99)
Potassium: 4.1 mmol/L (ref 3.5–5.2)
Sodium: 142 mmol/L (ref 134–144)
Total Protein: 6.7 g/dL (ref 6.0–8.5)
eGFR: 74 mL/min/{1.73_m2} (ref >59)

## 2023-08-16 LAB — LIPID PANEL
Chol/HDL Ratio: 3.3 ratio (ref 0.0–4.4)
Cholesterol, Total: 165 mg/dL (ref 100–199)
HDL: 50 mg/dL (ref >39)
LDL Chol Calc (NIH): 102 mg/dL — ABNORMAL HIGH (ref 0–99)
Triglycerides: 69 mg/dL (ref 0–149)
VLDL Cholesterol Cal: 13 mg/dL (ref 5–40)

## 2023-08-16 LAB — CBC WITH DIFFERENTIAL/PLATELET
Basophils Absolute: 0 10*3/uL (ref 0.0–0.2)
Basos: 1 %
EOS (ABSOLUTE): 0.1 10*3/uL (ref 0.0–0.4)
Eos: 2 %
Hematocrit: 40.2 % (ref 34.0–46.6)
Hemoglobin: 11.7 g/dL (ref 11.1–15.9)
Immature Grans (Abs): 0 10*3/uL (ref 0.0–0.1)
Immature Granulocytes: 0 %
Lymphocytes Absolute: 1.7 10*3/uL (ref 0.7–3.1)
Lymphs: 37 %
MCH: 24.4 pg — ABNORMAL LOW (ref 26.6–33.0)
MCHC: 29.1 g/dL — ABNORMAL LOW (ref 31.5–35.7)
MCV: 84 fL (ref 79–97)
Monocytes Absolute: 0.4 10*3/uL (ref 0.1–0.9)
Monocytes: 9 %
Neutrophils Absolute: 2.3 10*3/uL (ref 1.4–7.0)
Neutrophils: 51 %
Platelets: 280 10*3/uL (ref 150–450)
RBC: 4.79 x10E6/uL (ref 3.77–5.28)
RDW: 14.5 % (ref 11.7–15.4)
WBC: 4.5 10*3/uL (ref 3.4–10.8)

## 2023-08-16 LAB — HEMOGLOBIN A1C
Est. average glucose Bld gHb Est-mCnc: 128 mg/dL
Hgb A1c MFr Bld: 6.1 % — ABNORMAL HIGH (ref 4.8–5.6)

## 2023-08-16 LAB — TSH: TSH: 2.43 u[IU]/mL (ref 0.450–4.500)

## 2023-08-17 ENCOUNTER — Ambulatory Visit: Payer: Self-pay | Admitting: Physician Assistant

## 2023-09-18 ENCOUNTER — Other Ambulatory Visit: Payer: Self-pay | Admitting: Physician Assistant

## 2023-09-18 DIAGNOSIS — I10 Essential (primary) hypertension: Secondary | ICD-10-CM

## 2023-09-18 DIAGNOSIS — E782 Mixed hyperlipidemia: Secondary | ICD-10-CM

## 2023-09-24 ENCOUNTER — Other Ambulatory Visit: Payer: Self-pay | Admitting: Physician Assistant

## 2023-09-24 DIAGNOSIS — I10 Essential (primary) hypertension: Secondary | ICD-10-CM

## 2023-09-24 DIAGNOSIS — E782 Mixed hyperlipidemia: Secondary | ICD-10-CM

## 2023-09-25 NOTE — Telephone Encounter (Signed)
 Copied from CRM [id number]. Topic: Clinical - Prescription Issue >> Sep 25, 2023  8:44 AM Cleave MATSU wrote: Reason for CRM: needs her amlodipine  sent in she is completely out also she said she supposed to have some kind of blood pressure medication and havent received it please follow up with pt

## 2023-09-25 NOTE — Telephone Encounter (Signed)
 Patient reports that she was only given a 30 day supply at the pharmacy when the prescription was sent in. Patient doesn't have any more. I went ahead and sent the prescription in for a 90 day supply with 1 refill.

## 2023-11-17 ENCOUNTER — Other Ambulatory Visit: Payer: Self-pay | Admitting: Physician Assistant

## 2023-11-17 DIAGNOSIS — E782 Mixed hyperlipidemia: Secondary | ICD-10-CM

## 2023-11-17 DIAGNOSIS — I10 Essential (primary) hypertension: Secondary | ICD-10-CM

## 2023-11-20 NOTE — Telephone Encounter (Signed)
 Requested Prescriptions  Pending Prescriptions Disp Refills   losartan  (COZAAR ) 50 MG tablet [Pharmacy Med Name: LOSARTAN  50MG  TABLETS] 90 tablet 0    Sig: TAKE 1 TABLET(50 MG) BY MOUTH DAILY     Cardiovascular:  Angiotensin Receptor Blockers Passed - 11/20/2023  8:57 AM      Passed - Cr in normal range and within 180 days    Creatinine, Ser  Date Value Ref Range Status  08/15/2023 0.91 0.57 - 1.00 mg/dL Final         Passed - K in normal range and within 180 days    Potassium  Date Value Ref Range Status  08/15/2023 4.1 3.5 - 5.2 mmol/L Final         Passed - Patient is not pregnant      Passed - Last BP in normal range    BP Readings from Last 1 Encounters:  05/22/23 136/87         Passed - Valid encounter within last 6 months    Recent Outpatient Visits           3 months ago Primary hypertension   [facility] Ambler, Janna, PA-C

## 2023-12-04 ENCOUNTER — Ambulatory Visit: Admitting: Family Medicine

## 2023-12-24 ENCOUNTER — Other Ambulatory Visit: Payer: Self-pay | Admitting: Physician Assistant

## 2023-12-24 DIAGNOSIS — E782 Mixed hyperlipidemia: Secondary | ICD-10-CM

## 2023-12-24 DIAGNOSIS — I10 Essential (primary) hypertension: Secondary | ICD-10-CM

## 2023-12-26 ENCOUNTER — Ambulatory Visit: Payer: Self-pay

## 2023-12-26 NOTE — Telephone Encounter (Signed)
 FYI Only or Action Required?: Action required by provider: request for appointment.  Patient was last seen in primary care on 08/15/2023 by Ostwalt, Janna, PA-C.  Called Nurse Triage reporting Abdominal Pain.  Symptoms began several months ago.  Interventions attempted: Nothing.  Symptoms are: unchanged.  Triage Disposition: See PCP Within 2 Weeks  Patient/caregiver understands and will follow disposition?: Yes  Copied from CRM [id number]. Topic: Clinical - Red Word Triage >> Dec 26, 2023  2:37 PM Rosaria BRAVO wrote: Red Word that prompted transfer to Nurse Triage: Abdominal pain Reason for Disposition  Abdominal pain is a chronic symptom (recurrent or ongoing AND present > 4 weeks)  Answer Assessment - Initial Assessment Questions No available appts today, scheduled 12/28/23. Advised UC/ED if symptoms worsen.  1. LOCATION: Where does it hurt?      Upper right pain; above belly button, on side; denies chest pain 2. RADIATION: Does the pain shoot anywhere else? (e.g., chest, back)     no 3. ONSET: When did the pain begin? (e.g., minutes, hours or days ago)      Months ago; aching yesterday 4. SUDDEN: Gradual or sudden onset?     gradual 5. PATTERN Does the pain come and go, or is it constant?     Comes and goes 6. SEVERITY: How bad is the pain?  (e.g., Scale 1-10; mild, moderate, or severe)     0/10; if moves to right, aching pain when reaching back 7. RECURRENT SYMPTOM: Have you ever had this type of stomach pain before? If Yes, ask: When was the last time? and What happened that time?      months 8. CAUSE: What do you think is causing the stomach pain? (e.g., gallstones, recent abdominal surgery)     unsure 9. RELIEVING/AGGRAVATING FACTORS: What makes it better or worse? (e.g., antacids, bending or twisting motion, bowel movement)     Only hurts when I turn to that side, when turning back 10. OTHER SYMPTOMS: Do you have any other symptoms? (e.g., back  pain, diarrhea, fever, urination pain, vomiting) Denies back pain, n/v/d, fever, chills, urination pain Last BM-yesterday, formed, normal; denies black stools, blood, mucous No blood thinners, injuries  Protocols used: Abdominal Pain - Female-A-AH

## 2023-12-28 ENCOUNTER — Ambulatory Visit: Admitting: Physician Assistant

## 2023-12-28 ENCOUNTER — Encounter: Payer: Self-pay | Admitting: Physician Assistant

## 2023-12-28 VITALS — BP 130/72 | HR 79 | Resp 16 | Ht 62.0 in | Wt 328.8 lb

## 2023-12-28 DIAGNOSIS — K5909 Other constipation: Secondary | ICD-10-CM

## 2023-12-28 DIAGNOSIS — G8929 Other chronic pain: Secondary | ICD-10-CM

## 2023-12-28 DIAGNOSIS — D508 Other iron deficiency anemias: Secondary | ICD-10-CM

## 2023-12-28 DIAGNOSIS — R7303 Prediabetes: Secondary | ICD-10-CM

## 2023-12-28 DIAGNOSIS — Z780 Asymptomatic menopausal state: Secondary | ICD-10-CM

## 2023-12-28 DIAGNOSIS — R1011 Right upper quadrant pain: Secondary | ICD-10-CM

## 2023-12-28 DIAGNOSIS — I10 Essential (primary) hypertension: Secondary | ICD-10-CM

## 2023-12-28 DIAGNOSIS — E782 Mixed hyperlipidemia: Secondary | ICD-10-CM

## 2023-12-28 NOTE — Progress Notes (Unsigned)
 Established patient visit  Patient: Felicia Ford   DOB: [birth year]   58 y.o. Female  MRN: [id number] Visit Date: 12/28/2023  Today's healthcare provider: Jolynn Spencer, PA-C   Chief Complaint  Patient presents with   Abdominal Pain    R upper stomach pain, constipation, BM has changed onset 2 months No otc medication taken to help   Subjective     HPI     Abdominal Pain    Additional comments: R upper stomach pain, constipation, BM has changed onset 2 months No otc medication taken to help      Last edited by Wilfred Hargis RAMAN, CMA on 12/28/2023  8:29 AM.       Discussed the use of AI scribe software for clinical note transcription with the patient, who gave verbal consent to proceed.  History of Present Illness Felicia Ford is a 58 year old female with hypertension who presents with right upper quadrant pain.  She experiences dull right upper quadrant pain that occasionally radiates to the back. The pain is intermittent, severe at times, reaching ten out of ten, especially when turning or using the restroom. It has been present for several months, occurring two or three times, and can last most of the day. There is a possible association with greasy or oily foods.  There is no fever, nausea, vomiting, diarrhea, or heartburn. She has persistent constipation and a slight recent weight gain. No history of kidney stones or urinary symptoms.  Her medications include amlodipine , losartan , and pravastatin . There is a recent issue with refilling amlodipine , although losartan  was recently refilled.       08/15/2023   10:14 AM 11/26/2014    9:14 AM 10/01/2014   10:16 AM  Depression screen PHQ 2/9  Decreased Interest 0 0 0  Down, Depressed, Hopeless 0 0 0  PHQ - 2 Score 0 0 0  Altered sleeping 3    Tired, decreased energy 3    Change in appetite 0    Feeling bad or failure about yourself  0    Trouble concentrating 0    Moving slowly or fidgety/restless 0    Suicidal thoughts  0    PHQ-9 Score 6    Difficult doing work/chores Very difficult         No data to display          Medications: Outpatient Medications Prior to Visit  Medication Sig   amLODipine  (NORVASC ) 5 MG tablet TAKE 1 TABLET(5 MG) BY MOUTH DAILY   Blood Glucose Monitoring Suppl (ACCU-CHEK GUIDE ME) w/Device KIT USE TO CHECK BLOOD SUGAR IN THE MORNINGS   ferrous sulfate  325 (65 FE) MG EC tablet TAKE 1 TABLET BY MOUTH EVERY MORNING AND 1 TABLET EVERY EVENING WITH MEALS   glucose blood (ACCU-CHEK GUIDE) test strip USE TO CHECK BLOOD SUGAR ONCE A DAY   hydrochlorothiazide  (HYDRODIURIL ) 25 MG tablet TAKE 1 TABLET(25 MG) BY MOUTH DAILY   lidocaine  (LIDODERM ) 5 % Place 1 patch onto the skin every 12 (twelve) hours. Remove & Discard patch within 12 hours or as directed by MD   losartan  (COZAAR ) 50 MG tablet TAKE 1 TABLET(50 MG) BY MOUTH DAILY   meloxicam (MOBIC) 7.5 MG tablet Take 7.5 mg by mouth 2 (two) times daily.   pravastatin  (PRAVACHOL ) 20 MG tablet TAKE 1 TABLET(20 MG) BY MOUTH DAILY   [DISCONTINUED] methocarbamol  (ROBAXIN ) 500 MG tablet Take 1 tablet (500 mg total) by mouth every 8 (eight) hours as needed.   [  DISCONTINUED] TRULICITY 3 MG/0.5ML SOPN Inject into the skin. (Patient not taking: Reported on 03/03/2023)   No facility-administered medications prior to visit.    Review of Systems  All other systems reviewed and are negative.  All negative Except see HPI   {Insert previous labs (optional):23779} {See past labs  Heme  Chem  Endocrine  Serology  Results Review (optional):1}   Objective    BP 130/72   Pulse 79   Resp 16   Ht 5' 2 (1.575 m)   Wt (!) 328 lb 12.8 oz (149.1 kg)   LMP  (LMP Unknown) Comment: 6 months ago  SpO2 100%   BMI 60.14 kg/m  {Insert last BP/Wt (optional):23777}{See vitals history (optional):1}   Physical Exam Vitals reviewed.  Constitutional:      General: She is not in acute distress.    Appearance: Normal appearance. She is  well-developed. She is not diaphoretic.  HENT:     Head: Normocephalic and atraumatic.  Eyes:     General: No scleral icterus.    Conjunctiva/sclera: Conjunctivae normal.  Neck:     Thyroid: No thyromegaly.  Cardiovascular:     Rate and Rhythm: Normal rate and regular rhythm.     Pulses: Normal pulses.     Heart sounds: Normal heart sounds. No murmur heard. Pulmonary:     Effort: Pulmonary effort is normal. No respiratory distress.     Breath sounds: Normal breath sounds. No wheezing, rhonchi or rales.  Musculoskeletal:     Cervical back: Neck supple.     Right lower leg: No edema.     Left lower leg: No edema.  Lymphadenopathy:     Cervical: No cervical adenopathy.  Skin:    General: Skin is warm and dry.     Findings: No rash.  Neurological:     Mental Status: She is alert and oriented to person, place, and time. Mental status is at baseline.  Psychiatric:        Mood and Affect: Mood normal.        Behavior: Behavior normal.      No results found for any visits on 12/28/23.    Assessment & Plan Right upper quadrant abdominal pain Intermittent dull pain, possibly related to gallbladder, liver, pancreas, or H. pylori. No fever, nausea, vomiting, or significant bowel changes except constipation. - Order blood work: liver enzymes, pancreatic enzymes, H. pylori testing. - Order abdominal ultrasound for gallbladder and liver evaluation. - Perform urinalysis to rule out kidney issues.  Constipation Chronic constipation without recent changes or significant abdominal pain. - Advise high fiber diet and increased water  intake.  Essential hypertension Managed with amlodipine  and losartan . Issues with prescription refills noted. - Ensure amlodipine  prescription is refilled. - Confirm losartan  prescription is up to date.  Mixed hyperlipidemia Slight elevation in lipid levels. Last lipid panel four months ago showed acceptable levels. - Order lipid panel for routine  monitoring.  Iron deficiency anemia Iron deficiency anemia noted.  Prediabetes Prediabetes noted.  Other iron deficiency anemia (Primary) *** - CBC with Differential/Platelet - Comprehensive metabolic panel with GFR - H. pylori breath test - Lipase - Amylase - Urinalysis, Routine w reflex microscopic - US  Abdomen Limited  2. Other constipation ***  3. Primary hypertension *** - CBC with Differential/Platelet - Comprehensive metabolic panel with GFR - H. pylori breath test - Lipase - Amylase - Urinalysis, Routine w reflex microscopic  4. Prediabetes *** - CBC with Differential/Platelet - Comprehensive metabolic panel with GFR - H.  pylori breath test - Lipase - Amylase - Urinalysis, Routine w reflex microscopic  5. Mixed hyperlipidemia *** - CBC with Differential/Platelet - Comprehensive metabolic panel with GFR - H. pylori breath test - Lipase - Amylase - Urinalysis, Routine w reflex microscopic - US  Abdomen Limited - Lipid panel  6. Chronic knee pain, unspecified laterality ***  7. Menopause ***  8. RUQ abdominal pain ***   No orders of the defined types were placed in this encounter.   No follow-ups on file.   The patient was advised to call back or seek an in-person evaluation if the symptoms worsen or if the condition fails to improve as anticipated.  I discussed the assessment and treatment plan with the patient. The patient was provided an opportunity to ask questions and all were answered. The patient agreed with the plan and demonstrated an understanding of the instructions.  I, Taeshawn Helfman, PA-C have reviewed all documentation for this visit. The documentation on 12/28/2023  for the exam, diagnosis, procedures, and orders are all accurate and complete.  Jolynn Spencer, [facility], MMS [facility] [phone] (phone) [phone] (fax)  [facility]

## 2023-12-29 DIAGNOSIS — R1011 Right upper quadrant pain: Secondary | ICD-10-CM | POA: Insufficient documentation

## 2023-12-30 LAB — CBC WITH DIFFERENTIAL/PLATELET
Basophils Absolute: 0 x10E3/uL (ref 0.0–0.2)
Basos: 1 %
EOS (ABSOLUTE): 0.1 x10E3/uL (ref 0.0–0.4)
Eos: 2 %
Hematocrit: 40.9 % (ref 34.0–46.6)
Hemoglobin: 12.1 g/dL (ref 11.1–15.9)
Immature Grans (Abs): 0 x10E3/uL (ref 0.0–0.1)
Immature Granulocytes: 0 %
Lymphocytes Absolute: 1.5 x10E3/uL (ref 0.7–3.1)
Lymphs: 38 %
MCH: 25 pg — ABNORMAL LOW (ref 26.6–33.0)
MCHC: 29.6 g/dL — ABNORMAL LOW (ref 31.5–35.7)
MCV: 85 fL (ref 79–97)
Monocytes Absolute: 0.4 x10E3/uL (ref 0.1–0.9)
Monocytes: 10 %
Neutrophils Absolute: 2 x10E3/uL (ref 1.4–7.0)
Neutrophils: 49 %
Platelets: 267 x10E3/uL (ref 150–450)
RBC: 4.84 x10E6/uL (ref 3.77–5.28)
RDW: 14.7 % (ref 11.7–15.4)
WBC: 4 x10E3/uL (ref 3.4–10.8)

## 2023-12-30 LAB — URINALYSIS, ROUTINE W REFLEX MICROSCOPIC
Bilirubin, UA: NEGATIVE
Glucose, UA: NEGATIVE
Ketones, UA: NEGATIVE
Leukocytes,UA: NEGATIVE
Nitrite, UA: NEGATIVE
Protein,UA: NEGATIVE
RBC, UA: NEGATIVE
Specific Gravity, UA: 1.026 (ref 1.005–1.030)
Urobilinogen, Ur: 0.2 mg/dL (ref 0.2–1.0)
pH, UA: 5 (ref 5.0–7.5)

## 2023-12-30 LAB — COMPREHENSIVE METABOLIC PANEL WITH GFR
ALT: 17 IU/L (ref 0–32)
AST: 19 IU/L (ref 0–40)
Albumin: 3.7 g/dL — ABNORMAL LOW (ref 3.8–4.9)
Alkaline Phosphatase: 122 IU/L (ref 49–135)
BUN/Creatinine Ratio: 12 (ref 9–23)
BUN: 12 mg/dL (ref 6–24)
Bilirubin Total: 0.2 mg/dL (ref 0.0–1.2)
CO2: 19 mmol/L — ABNORMAL LOW (ref 20–29)
Calcium: 9.2 mg/dL (ref 8.7–10.2)
Chloride: 104 mmol/L (ref 96–106)
Creatinine, Ser: 1.02 mg/dL — ABNORMAL HIGH (ref 0.57–1.00)
Globulin, Total: 3.1 g/dL (ref 1.5–4.5)
Glucose: 106 mg/dL — ABNORMAL HIGH (ref 70–99)
Potassium: 3.9 mmol/L (ref 3.5–5.2)
Sodium: 140 mmol/L (ref 134–144)
Total Protein: 6.8 g/dL (ref 6.0–8.5)
eGFR: 64 mL/min/1.73 (ref >59)

## 2023-12-30 LAB — LIPID PANEL
Chol/HDL Ratio: 3.8 ratio (ref 0.0–4.4)
Cholesterol, Total: 169 mg/dL (ref 100–199)
HDL: 45 mg/dL (ref >39)
LDL Chol Calc (NIH): 112 mg/dL — ABNORMAL HIGH (ref 0–99)
Triglycerides: 63 mg/dL (ref 0–149)
VLDL Cholesterol Cal: 12 mg/dL (ref 5–40)

## 2023-12-30 LAB — LIPASE: Lipase: 10 U/L — ABNORMAL LOW (ref 14–72)

## 2023-12-30 LAB — H. PYLORI BREATH TEST: H pylori Breath Test: NEGATIVE

## 2023-12-30 LAB — AMYLASE: Amylase: 51 U/L (ref 31–110)

## 2024-01-01 ENCOUNTER — Ambulatory Visit: Payer: Self-pay | Admitting: Physician Assistant

## 2024-01-01 ENCOUNTER — Ambulatory Visit
Admission: RE | Admit: 2024-01-01 | Discharge: 2024-01-01 | Disposition: A | Discharge status: Home / Self Care | Source: Ambulatory Visit | Attending: Physician Assistant | Admitting: Physician Assistant

## 2024-01-01 DIAGNOSIS — E782 Mixed hyperlipidemia: Secondary | ICD-10-CM | POA: Insufficient documentation

## 2024-01-01 DIAGNOSIS — D508 Other iron deficiency anemias: Secondary | ICD-10-CM | POA: Diagnosis present

## 2024-01-26 LAB — COLOGUARD: COLOGUARD: NEGATIVE

## 2024-04-01 ENCOUNTER — Other Ambulatory Visit: Payer: Self-pay | Admitting: Physician Assistant

## 2024-04-01 DIAGNOSIS — E782 Mixed hyperlipidemia: Secondary | ICD-10-CM

## 2024-04-01 DIAGNOSIS — I10 Essential (primary) hypertension: Secondary | ICD-10-CM
